# Patient Record
Sex: Female | Born: 1980 | Race: Asian | Hispanic: No | Marital: Married | Smoking: Never smoker
Health system: Southern US, Community
[De-identification: ages and names within clinical notes are randomized; demographics above are authoritative.]

## PROBLEM LIST (undated history)

## (undated) DIAGNOSIS — Z8759 Personal history of other complications of pregnancy, childbirth and the puerperium: Secondary | ICD-10-CM

## (undated) HISTORY — PX: WISDOM TOOTH EXTRACTION: SHX21

---

## 2011-02-01 ENCOUNTER — Other Ambulatory Visit (HOSPITAL_COMMUNITY)
Admission: RE | Admit: 2011-02-01 | Discharge: 2011-02-01 | Disposition: A | Discharge status: Home / Self Care | Payer: PRIVATE HEALTH INSURANCE | Source: Ambulatory Visit | Attending: Family Medicine | Admitting: Family Medicine

## 2011-02-01 DIAGNOSIS — Z124 Encounter for screening for malignant neoplasm of cervix: Secondary | ICD-10-CM | POA: Insufficient documentation

## 2011-02-01 DIAGNOSIS — Z1159 Encounter for screening for other viral diseases: Secondary | ICD-10-CM | POA: Insufficient documentation

## 2011-02-01 DIAGNOSIS — N760 Acute vaginitis: Secondary | ICD-10-CM | POA: Insufficient documentation

## 2011-05-30 ENCOUNTER — Other Ambulatory Visit: Payer: Self-pay | Admitting: Family Medicine

## 2011-05-31 ENCOUNTER — Other Ambulatory Visit: Payer: Self-pay | Admitting: Family Medicine

## 2011-05-31 DIAGNOSIS — N649 Disorder of breast, unspecified: Secondary | ICD-10-CM

## 2011-06-14 ENCOUNTER — Ambulatory Visit
Admission: RE | Admit: 2011-06-14 | Discharge: 2011-06-14 | Disposition: A | Discharge status: Home / Self Care | Payer: 59 | Source: Ambulatory Visit | Attending: Family Medicine | Admitting: Family Medicine

## 2011-06-14 DIAGNOSIS — N649 Disorder of breast, unspecified: Secondary | ICD-10-CM

## 2013-03-08 ENCOUNTER — Other Ambulatory Visit: Payer: Self-pay | Admitting: Family Medicine

## 2013-03-08 DIAGNOSIS — N926 Irregular menstruation, unspecified: Secondary | ICD-10-CM

## 2013-03-15 ENCOUNTER — Ambulatory Visit
Admission: RE | Admit: 2013-03-15 | Discharge: 2013-03-15 | Disposition: A | Discharge status: Home / Self Care | Payer: BC Managed Care – PPO | Source: Ambulatory Visit | Attending: Family Medicine | Admitting: Family Medicine

## 2013-03-15 DIAGNOSIS — N926 Irregular menstruation, unspecified: Secondary | ICD-10-CM

## 2013-04-14 ENCOUNTER — Encounter (HOSPITAL_COMMUNITY): Payer: Self-pay | Admitting: Pharmacist

## 2013-04-14 NOTE — H&P (Signed)
GYN H&P  Natasha Carpenter is 32y G0 who presents for removal of her deromid cyst.  She was referred to my office for intermittent lower abdominal pain.  The patient reports the pain as 7/10 when it is at it's worst and feels the pain mostly when sitting down or when sleeping. An ultrasound was done to assess her pain and revealed a multilobulated complex mass on her left (see below imaging studies).  She does report occasional abdominal pain today, but states the pain is only a 2/10. She denies fever/chills, nausea/vomiting.  She is currently on her menses and reports minmal vaginal bleeding, requiring only one pad per day.    Medical History: H/o left breast cyst- 2012.        Gyn History:  Sexual activity currently sexually active with fiance of 4 years.  Periods : every month.  LMP  : 04/02/13 Birth control none.  Last pap smear date 02/01/2011 Negative.  Last mammogram date 06/14/11.  STD none.        OB History:  Never been pregnant per patient.        Surgical History: Denies Past Surgical History.        Hospitalization/Major Diagnostic Procedure: Denies Past Hospitalization.        Family History: Father: alive, stroke at 28, HTNMother: alive, A + WSister 1: alive, A + Teacher, music 2: alive, A + W No family history of colon, breast or ovarian cancer per pt No heart disease. No DM, denies any GYN family cancer hx.       Social History:  General:  Tobacco use  cigarettes: Never smoked no Smoking.  no Tobacco Exposure.  no Alcohol.  Caffeine: yes, tea, very little.  no Recreational drug use.  Diet: yes.  Exercise: yes, walks, 2-3 x weekly.  Occupation: employed, Airline pilot.  Marital Status: engaged.  Children: none.        Medications: Taking Advil 200 MG Tablet 1 tablet as needed every 6 hrs, Discontinued Junel 1/20 1-20 MG-MCG Tablet 1 tablet Once a day, Medication List reviewed and reconciled with the patient       Allergies: N.K.D.A.          Vitals: Wt 98, Wt  change -.8 lb, Ht 59.5, BMI 19.46, Pulse sitting 84, BP sitting 112/76.     Vitals: BMI: 19.46, BP: 112/76 Gen: NAD CV: RRR Lungs: CTAB Abd: soft, non-tender, non-distended, no rebound, no guarding GU (performed 10/3): normal external genitalia, vagina - pink moist mucosa, minimal menstrual bleeding appreciated. On bimanual exam: left mass palpated, mobile, non-tender, + posterior fullness.   U/S on 03/15/13: Endometrial thickness: 7mm. Posterior uterine fibroid 2cm x 1cm x 2cm. L ovary with multilobulated complex mass with calcifications consistent with dermoid cyst- 6cm x 3cm x 5.2cm. Right ovary with small 2.2cm x 1.8cm x 2.1cm thin walled septation.  A/P: 32y G0 who presents for laparoscopic cystectomy due to left dermoid cyst. -NPO @ midnight prior to surgery -LR @ 125cc/hr -urine pregnancy & CBC to be collected prior to surgery -no antibiotics indicated -Reviewed with patient- risk, benefit, indications and alternatives including risk of bleeding, infection, injury to other organs and possible removal of left ovary.  All questions and concerns were answered.  Myna Hidalgo, DO [phone] (pager) [phone] (office)

## 2013-04-22 ENCOUNTER — Encounter (HOSPITAL_COMMUNITY): Payer: Self-pay | Admitting: *Deleted

## 2013-04-22 ENCOUNTER — Ambulatory Visit (HOSPITAL_COMMUNITY): Payer: BC Managed Care – PPO | Admitting: Anesthesiology

## 2013-04-22 ENCOUNTER — Encounter (HOSPITAL_COMMUNITY): Payer: BC Managed Care – PPO | Admitting: Anesthesiology

## 2013-04-22 ENCOUNTER — Encounter (HOSPITAL_COMMUNITY)
Admission: RE | Disposition: A | Discharge status: Home / Self Care | Payer: Self-pay | Source: Ambulatory Visit | Attending: Obstetrics & Gynecology

## 2013-04-22 ENCOUNTER — Ambulatory Visit (HOSPITAL_COMMUNITY)
Admission: RE | Admit: 2013-04-22 | Discharge: 2013-04-22 | Disposition: A | Discharge status: Home / Self Care | Payer: BC Managed Care – PPO | Source: Ambulatory Visit | Attending: Obstetrics & Gynecology | Admitting: Obstetrics & Gynecology

## 2013-04-22 DIAGNOSIS — N83209 Unspecified ovarian cyst, unspecified side: Secondary | ICD-10-CM | POA: Insufficient documentation

## 2013-04-22 DIAGNOSIS — N7013 Chronic salpingitis and oophoritis: Secondary | ICD-10-CM | POA: Insufficient documentation

## 2013-04-22 DIAGNOSIS — N854 Malposition of uterus: Secondary | ICD-10-CM | POA: Insufficient documentation

## 2013-04-22 DIAGNOSIS — N80109 Endometriosis of ovary, unspecified side, unspecified depth: Secondary | ICD-10-CM | POA: Insufficient documentation

## 2013-04-22 DIAGNOSIS — N801 Endometriosis of ovary: Secondary | ICD-10-CM | POA: Insufficient documentation

## 2013-04-22 HISTORY — PX: LAPAROSCOPY: SHX197

## 2013-04-22 LAB — CBC
HCT: 42.1 % (ref 36.0–46.0)
Hemoglobin: 14.5 g/dL (ref 12.0–15.0)
MCHC: 34.4 g/dL (ref 30.0–36.0)
RBC: 4.82 MIL/uL (ref 3.87–5.11)
WBC: 5.1 10*3/uL (ref 4.0–10.5)

## 2013-04-22 LAB — HCG, QUANTITATIVE, PREGNANCY: hCG, Beta Chain, Quant, S: <1 m[IU]/mL (ref <5)

## 2013-04-22 SURGERY — LAPAROSCOPY OPERATIVE
Anesthesia: General | Site: Abdomen | Wound class: Clean Contaminated

## 2013-04-22 MED ORDER — KETOROLAC TROMETHAMINE 30 MG/ML IJ SOLN: 15.0000 mg | Freq: Once | INTRAMUSCULAR | Status: DC | PRN

## 2013-04-22 MED ORDER — MIDAZOLAM HCL 2 MG/2ML IJ SOLN
INTRAMUSCULAR | Status: AC
  Filled 2013-04-22: qty 2

## 2013-04-22 MED ORDER — KETOROLAC TROMETHAMINE 30 MG/ML IJ SOLN
INTRAMUSCULAR | Status: DC | PRN
  Administered 2013-04-22: 30 mg via INTRAVENOUS

## 2013-04-22 MED ORDER — KETOROLAC TROMETHAMINE 30 MG/ML IJ SOLN
INTRAMUSCULAR | Status: AC
  Filled 2013-04-22: qty 1

## 2013-04-22 MED ORDER — ACETAMINOPHEN 160 MG/5ML PO SOLN
975.0000 mg | Freq: Once | ORAL | Status: AC
  Administered 2013-04-22: 975 mg via ORAL

## 2013-04-22 MED ORDER — ONDANSETRON HCL 4 MG/2ML IJ SOLN
INTRAMUSCULAR | Status: AC
  Filled 2013-04-22: qty 2

## 2013-04-22 MED ORDER — IBUPROFEN 200 MG PO TABS: 600.0000 mg | ORAL_TABLET | Freq: Four times a day (QID) | ORAL | Status: DC | PRN

## 2013-04-22 MED ORDER — ONDANSETRON HCL 4 MG/2ML IJ SOLN
INTRAMUSCULAR | Status: DC | PRN
  Administered 2013-04-22: 4 mg via INTRAVENOUS

## 2013-04-22 MED ORDER — LACTATED RINGERS IV SOLN
INTRAVENOUS | Status: DC
  Administered 2013-04-22 (×2): via INTRAVENOUS

## 2013-04-22 MED ORDER — PROPOFOL 10 MG/ML IV EMUL
INTRAVENOUS | Status: AC
  Filled 2013-04-22: qty 20

## 2013-04-22 MED ORDER — ROCURONIUM BROMIDE 100 MG/10ML IV SOLN
INTRAVENOUS | Status: DC | PRN
  Administered 2013-04-22: 30 mg via INTRAVENOUS

## 2013-04-22 MED ORDER — LIDOCAINE HCL (CARDIAC) 20 MG/ML IV SOLN
INTRAVENOUS | Status: DC | PRN
  Administered 2013-04-22: 100 mg via INTRAVENOUS

## 2013-04-22 MED ORDER — PROPOFOL 10 MG/ML IV BOLUS
INTRAVENOUS | Status: DC | PRN
  Administered 2013-04-22: 150 mg via INTRAVENOUS

## 2013-04-22 MED ORDER — DEXAMETHASONE SODIUM PHOSPHATE 10 MG/ML IJ SOLN
INTRAMUSCULAR | Status: AC
  Filled 2013-04-22: qty 1

## 2013-04-22 MED ORDER — FENTANYL CITRATE 0.05 MG/ML IJ SOLN: 25.0000 ug | INTRAMUSCULAR | Status: DC | PRN

## 2013-04-22 MED ORDER — ROCURONIUM BROMIDE 100 MG/10ML IV SOLN
INTRAVENOUS | Status: AC
  Filled 2013-04-22: qty 1

## 2013-04-22 MED ORDER — FENTANYL CITRATE 0.05 MG/ML IJ SOLN
INTRAMUSCULAR | Status: DC | PRN
  Administered 2013-04-22 (×3): 50 ug via INTRAVENOUS

## 2013-04-22 MED ORDER — NEOSTIGMINE METHYLSULFATE 1 MG/ML IJ SOLN
INTRAMUSCULAR | Status: AC
  Filled 2013-04-22: qty 1

## 2013-04-22 MED ORDER — HEPARIN SODIUM (PORCINE) 5000 UNIT/ML IJ SOLN
INTRAMUSCULAR | Status: AC
  Filled 2013-04-22: qty 1

## 2013-04-22 MED ORDER — ACETAMINOPHEN 160 MG/5ML PO SOLN
ORAL | Status: AC
  Administered 2013-04-22: 975 mg via ORAL
  Filled 2013-04-22: qty 40.6

## 2013-04-22 MED ORDER — NEOSTIGMINE METHYLSULFATE 1 MG/ML IJ SOLN
INTRAMUSCULAR | Status: DC | PRN
  Administered 2013-04-22: 3 mg via INTRAVENOUS

## 2013-04-22 MED ORDER — LACTATED RINGERS IV SOLN
INTRAVENOUS | Status: DC
  Administered 2013-04-22: 11:00:00 via INTRAVENOUS

## 2013-04-22 MED ORDER — LACTATED RINGERS IV SOLN
INTRAVENOUS | Status: DC | PRN
  Administered 2013-04-22: 1

## 2013-04-22 MED ORDER — MIDAZOLAM HCL 2 MG/2ML IJ SOLN
INTRAMUSCULAR | Status: DC | PRN
  Administered 2013-04-22: 2 mg via INTRAVENOUS

## 2013-04-22 MED ORDER — MEPERIDINE HCL 25 MG/ML IJ SOLN: 6.2500 mg | INTRAMUSCULAR | Status: DC | PRN

## 2013-04-22 MED ORDER — FENTANYL CITRATE 0.05 MG/ML IJ SOLN
INTRAMUSCULAR | Status: AC
  Filled 2013-04-22: qty 2

## 2013-04-22 MED ORDER — GLYCOPYRROLATE 0.2 MG/ML IJ SOLN
INTRAMUSCULAR | Status: DC | PRN
  Administered 2013-04-22: 0.4 mg via INTRAVENOUS

## 2013-04-22 MED ORDER — SODIUM CHLORIDE 0.9 % IJ SOLN
INTRAMUSCULAR | Status: AC
  Filled 2013-04-22: qty 10

## 2013-04-22 MED ORDER — LIDOCAINE HCL (CARDIAC) 20 MG/ML IV SOLN
INTRAVENOUS | Status: AC
  Filled 2013-04-22: qty 5

## 2013-04-22 MED ORDER — DOCUSATE SODIUM 50 MG PO CAPS: 100.0000 mg | ORAL_CAPSULE | Freq: Two times a day (BID) | ORAL | Status: DC | PRN

## 2013-04-22 MED ORDER — HYDROCODONE-ACETAMINOPHEN 5-325 MG PO TABS: 1.0000 | ORAL_TABLET | Freq: Four times a day (QID) | ORAL | Status: DC | PRN

## 2013-04-22 MED ORDER — DEXAMETHASONE SODIUM PHOSPHATE 10 MG/ML IJ SOLN
INTRAMUSCULAR | Status: DC | PRN
  Administered 2013-04-22: 10 mg via INTRAVENOUS

## 2013-04-22 MED ORDER — METOCLOPRAMIDE HCL 5 MG/ML IJ SOLN: 10.0000 mg | Freq: Once | INTRAMUSCULAR | Status: DC | PRN

## 2013-04-22 MED ORDER — SODIUM CHLORIDE 0.9 % IJ SOLN
INTRAMUSCULAR | Status: DC | PRN
  Administered 2013-04-22: 10 mL

## 2013-04-22 MED ORDER — FENTANYL CITRATE 0.05 MG/ML IJ SOLN
INTRAMUSCULAR | Status: AC
  Filled 2013-04-22: qty 5

## 2013-04-22 MED ORDER — BUPIVACAINE HCL (PF) 0.25 % IJ SOLN
INTRAMUSCULAR | Status: DC | PRN
  Administered 2013-04-22: 7 mL

## 2013-04-22 MED ORDER — BUPIVACAINE HCL (PF) 0.25 % IJ SOLN
INTRAMUSCULAR | Status: AC
  Filled 2013-04-22: qty 30

## 2013-04-22 MED ORDER — GLYCOPYRROLATE 0.2 MG/ML IJ SOLN
INTRAMUSCULAR | Status: AC
  Filled 2013-04-22: qty 3

## 2013-04-22 SURGICAL SUPPLY — 29 items
BENZOIN TINCTURE PRP APPL 2/3 (GAUZE/BANDAGES/DRESSINGS) IMPLANT
CHLORAPREP W/TINT 26ML (MISCELLANEOUS) ×2 IMPLANT
CLOTH BEACON ORANGE TIMEOUT ST (SAFETY) ×2 IMPLANT
DERMABOND ADVANCED (GAUZE/BANDAGES/DRESSINGS) ×1
DERMABOND ADVANCED .7 DNX12 (GAUZE/BANDAGES/DRESSINGS) ×1 IMPLANT
FORCEPS CUTTING 33CM 5MM (CUTTING FORCEPS) IMPLANT
GLOVE BIO SURGEON STRL SZ 6.5 (GLOVE) ×4 IMPLANT
GLOVE BIOGEL PI IND STRL 7.0 (GLOVE) ×2 IMPLANT
GLOVE BIOGEL PI INDICATOR 7.0 (GLOVE) ×2
GLOVE NEODERM STER SZ 7 (GLOVE) ×2 IMPLANT
GOWN PREVENTION PLUS LG XLONG (DISPOSABLE) ×4 IMPLANT
HEMOSTAT SURGICEL 2X3 (HEMOSTASIS) ×2 IMPLANT
MANIPULATOR UTERINE 4.5 ZUMI (MISCELLANEOUS) ×2 IMPLANT
NEEDLE INSUFFLATION 120MM (ENDOMECHANICALS) ×2 IMPLANT
PACK LAPAROSCOPY BASIN (CUSTOM PROCEDURE TRAY) ×2 IMPLANT
POUCH SPECIMEN RETRIEVAL 10MM (ENDOMECHANICALS) IMPLANT
PROTECTOR NERVE ULNAR (MISCELLANEOUS) ×2 IMPLANT
SCALPEL HARMONIC ACE (MISCELLANEOUS) IMPLANT
SET IRRIG TUBING LAPAROSCOPIC (IRRIGATION / IRRIGATOR) ×2 IMPLANT
STRIP CLOSURE SKIN 1/4X4 (GAUZE/BANDAGES/DRESSINGS) IMPLANT
SUT VIC AB 4-0 PS2 27 (SUTURE) ×2 IMPLANT
SUT VICRYL 0 UR6 27IN ABS (SUTURE) ×2 IMPLANT
TOWEL OR 17X24 6PK STRL BLUE (TOWEL DISPOSABLE) ×4 IMPLANT
TRAY FOLEY CATH 14FR (SET/KITS/TRAYS/PACK) ×2 IMPLANT
TROCAR XCEL NON-BLD 11X100MML (ENDOMECHANICALS) ×2 IMPLANT
TROCAR XCEL NON-BLD 5MMX100MML (ENDOMECHANICALS) ×2 IMPLANT
TROCAR XCEL OPT SLVE 5M 100M (ENDOMECHANICALS) ×2 IMPLANT
WARMER LAPAROSCOPE (MISCELLANEOUS) ×2 IMPLANT
WATER STERILE IRR 1000ML POUR (IV SOLUTION) ×2 IMPLANT

## 2013-04-22 NOTE — Anesthesia Preprocedure Evaluation (Addendum)
Anesthesia Evaluation  Patient identified by MRN, date of birth, ID band Patient awake    Reviewed: Allergy & Precautions, H&P , NPO status , Patient's Chart, lab work & pertinent test results, reviewed documented beta blocker date and time   History of Anesthesia Complications Negative for: history of anesthetic complications  Airway Mallampati: II TM Distance: >3 FB Neck ROM: full    Dental  (+) Teeth Intact Full braces:   Pulmonary neg pulmonary ROS,  breath sounds clear to auscultation  Pulmonary exam normal       Cardiovascular negative cardio ROS  Rhythm:regular Rate:Normal     Neuro/Psych negative neurological ROS  negative psych ROS   GI/Hepatic negative GI ROS, Neg liver ROS,   Endo/Other  negative endocrine ROS  Renal/GU negative Renal ROS  Female GU complaint     Musculoskeletal   Abdominal   Peds  Hematology negative hematology ROS (+)   Anesthesia Other Findings   Reproductive/Obstetrics negative OB ROS                          Anesthesia Physical Anesthesia Plan  ASA: I  Anesthesia Plan: General ETT   Post-op Pain Management:    Induction:   Airway Management Planned:   Additional Equipment:   Intra-op Plan:   Post-operative Plan:   Informed Consent: I have reviewed the patients History and Physical, chart, labs and discussed the procedure including the risks, benefits and alternatives for the proposed anesthesia with the patient or authorized representative who has indicated his/her understanding and acceptance.   Dental Advisory Given  Plan Discussed with: CRNA and Surgeon  Anesthesia Plan Comments:         Anesthesia Quick Evaluation

## 2013-04-22 NOTE — Anesthesia Postprocedure Evaluation (Signed)
  Anesthesia Post Note  Patient: Natasha Carpenter  Procedure(s) Performed: Procedure(s) (LRB): Diagnostic Operative Laparoscopy, Lysis of Adhesions, Drainage of Endometriomas (N/A)  Anesthesia type: GA  Patient location: PACU  Post pain: Pain level controlled  Post assessment: Post-op Vital signs reviewed  Last Vitals:  Filed Vitals:   04/22/13 1445  BP: 97/63  Pulse: 66  Temp:   Resp: 13    Post vital signs: Reviewed  Level of consciousness: sedated  Complications: No apparent anesthesia complications

## 2013-04-22 NOTE — Interval H&P Note (Signed)
History and Physical Interval Note:  04/22/2013 11:22 AM  Natasha Carpenter  has presented today for surgery, with the diagnosis of an Ovarian Cyst CPT [phone]  The various methods of treatment have been discussed with the patient and family. After consideration of risks, benefits and other options for treatment, the patient has consented to  Procedure(s): LAPAROSCOPY OPERATIVE WITH OVARIAN CYSTECTOMY (N/A) as a surgical intervention .  The patient's history has been reviewed, patient examined, no change in status, stable for surgery.  I have reviewed the patient's chart and labs.  Questions were answered to the patient's satisfaction.     Myna Hidalgo, M

## 2013-04-22 NOTE — Op Note (Signed)
04/22/2013  2:39 PM  PATIENT:  Natasha Carpenter  32 y.o. female  PRE-OPERATIVE DIAGNOSIS:  Ovarian Cyst CPT [phone]  POST-OPERATIVE DIAGNOSIS:  Ovarian Cyst, endometriosis  PROCEDURE:  Operative Laparoscopy, Lysis of Adhesions, Drainage of Endometriomas  SURGEON:  Surgeon(s) and Role:    * Sharon Seller, DO - Primary    * Geryl Rankins, MD - Assisting   ANESTHESIA:   general  EBL:  Total I/O In: 1600 [I.V.:1600] Out: 300 [Urine:250; Blood:50]  BLOOD ADMINISTERED:none  DRAINS: Urinary Catheter (Foley)   LOCAL MEDICATIONS USED:  MARCAINE     SPECIMEN:  Source of Specimen:  left ovarian cyst wall  DISPOSITION OF SPECIMEN:  PATHOLOGY  COUNTS:  YES   FINDINGS: No ascites or peritoneal studding was appreciated on entry into the abdomen.  Liver, gallbladder and bowel appeared grossly normal.  Appendix not visualized.  Uterus normal size and shape.  Right tube and ovary normal in appearance.  Left ovary with multiple endometriomas distorting the entire left adnexa.  Total left ovarian size 5cmx7cm.  Three endometriomas each approximately 2-3cm with chocolate filled fluid.  Left hydrosalpinx.  Dense adhesions noted between left fallopian tube, ovary, pelvic side wall and bowel. Both black powder-burn and red lesions were noted in both the anterior and posterior cul-de-sac, left adnexa and left pelvic side wall.  Some obliteration of the posterior cul-de-sac due to adhesions was also appreciated.    Informed consent was obtained from the patient prior to taking her to the operating room where anesthesia was found to be adequate. She was placed in dorsal lithotomy position and examined under anesthesia. She was prepped and draped in normal sterile fashion. The bladder was catheterized with a foley under sterile technique.  A bi-valve speculum was then placed and the anterior lip of the cervix was grasped with the single tooth tenaculum. The retroverted uterus was sounded to 7 cm and a  manipulator was advanced into the uterus to provide uterine mobility. The speculum and was then removed.  Attention was then turned to the patient's abdomen where a 10 mm infraumbilical skin incision was made with the scalpel. The veress needle was carefully introduced into the peritoneal cavity while tenting the abdominal wall. Intraperitoneal placement was confirmed by use of a saline-drop test.  The gas was connected and confirmed intrabdominal placement by a low initial pressure of 7 mmHg. The abdomen was then insuflated with CO2 gas. The trocar and sleeve were then advanced without difficulty into the abdomen under direct visualization. Intraabdominal placement was confirmed by the laparoscope and surveillance of the abdomen was performed. Findings as described above. Two additional 5mm skin incision were made in the left and right lower quadrants with placement of the trocar under direct visualization.     In an attempt to normalize the anatomy, the left adnexa was untwisted and filmy adhesions between the bowel and left adnexal were taken down with gentle traction.  The ovarian cysts were ruptured with suction of chocolate colored fluid.  Due to the extent of the endometriomas, separation of the cyst wall from normal ovarian tissue was very difficult.  A small portion of the cyst wall was removed and sent to pathology.  The left fallopian tube was dilated and noted to be adherent to the bowel and adjacent inferior pelvic wall that was not able to be separated with gentle traction.  Moreover, the adhesions obstructed visualization of the ureter.  The pelvis was copiously washed and irrigated.  Bleeding noted between the tube  and ovary was cauterized using bipolar.  Hemostasis was obtained and surgicel was placed over the left fallopian tube and ovary.  Re-examination of the uterus and abdominal cavity confirmed hemostasis.  All trocars were removed under direct visualization allowing gas to fully escape.   The fascia was closed using 0-vicryl without difficulty under direct visualization.  The abdominal port site was closed with 4-0 vicryl and dermabond.  The lateral ports were closed with dermabond.  The manipulator was then removed from the cervix with no lacerations or bleeding identified. The patient tolerated the procedure well and was taken to recovery in stable condition.   PLAN OF CARE: Discharge to home after PACU  PATIENT DISPOSITION:  PACU - hemodynamically stable.   Delay start of Pharmacological VTE agent (>24hrs) due to surgical blood loss or risk of bleeding: not applicable

## 2013-04-22 NOTE — Transfer of Care (Signed)
Immediate Anesthesia Transfer of Care Note  Patient: Natasha Carpenter  Procedure(s) Performed: Procedure(s): Diagnostic Operative Laparoscopy, Lysis of Adhesions, Drainage of Endometriomas (N/A)  Patient Location: PACU  Anesthesia Type:General  Level of Consciousness: awake  Airway & Oxygen Therapy: Patient Spontanous Breathing and Patient connected to nasal cannula oxygen  Post-op Assessment: Report given to PACU RN and Post -op Vital signs reviewed and stable  Post vital signs: Reviewed and stable  Complications: No apparent anesthesia complications

## 2013-04-22 NOTE — Anesthesia Procedure Notes (Signed)
Procedure Name: Intubation Date/Time: 04/22/2013 12:14 PM Performed by: Nilaya Bouie, Jannet Askew Pre-anesthesia Checklist: Patient identified, Timeout performed, Emergency Drugs available, Suction available and Patient being monitored Patient Re-evaluated:Patient Re-evaluated prior to inductionOxygen Delivery Method: Circle system utilized Preoxygenation: Pre-oxygenation with 100% oxygen Intubation Type: IV induction Ventilation: Mask ventilation without difficulty Laryngoscope Size: Mac and 3 Tube size: 7.0 mm Number of attempts: 1 Secured at: 19 cm Dental Injury: Teeth and Oropharynx as per pre-operative assessment

## 2013-04-23 ENCOUNTER — Encounter (HOSPITAL_COMMUNITY): Payer: Self-pay | Admitting: Obstetrics & Gynecology

## 2013-06-25 ENCOUNTER — Other Ambulatory Visit (HOSPITAL_COMMUNITY): Payer: Self-pay | Admitting: Obstetrics and Gynecology

## 2013-06-25 DIAGNOSIS — IMO0002 Reserved for concepts with insufficient information to code with codable children: Secondary | ICD-10-CM

## 2013-07-05 ENCOUNTER — Encounter (INDEPENDENT_AMBULATORY_CARE_PROVIDER_SITE_OTHER): Payer: Self-pay

## 2013-07-05 ENCOUNTER — Ambulatory Visit (HOSPITAL_COMMUNITY)
Admission: RE | Admit: 2013-07-05 | Discharge: 2013-07-05 | Disposition: A | Discharge status: Home / Self Care | Payer: BC Managed Care – PPO | Source: Ambulatory Visit | Attending: Obstetrics and Gynecology | Admitting: Obstetrics and Gynecology

## 2013-07-05 DIAGNOSIS — N979 Female infertility, unspecified: Secondary | ICD-10-CM | POA: Insufficient documentation

## 2013-07-05 DIAGNOSIS — IMO0002 Reserved for concepts with insufficient information to code with codable children: Secondary | ICD-10-CM

## 2013-07-05 MED ORDER — IOHEXOL 300 MG/ML  SOLN
20.0000 mL | Freq: Once | INTRAMUSCULAR | Status: AC | PRN
  Administered 2013-07-05: 12 mL

## 2013-11-11 ENCOUNTER — Other Ambulatory Visit: Payer: Self-pay | Admitting: Family Medicine

## 2013-11-11 ENCOUNTER — Other Ambulatory Visit (HOSPITAL_COMMUNITY)
Admission: RE | Admit: 2013-11-11 | Discharge: 2013-11-11 | Disposition: A | Discharge status: Home / Self Care | Payer: BC Managed Care – PPO | Source: Ambulatory Visit | Attending: Family Medicine | Admitting: Family Medicine

## 2013-11-11 DIAGNOSIS — Z124 Encounter for screening for malignant neoplasm of cervix: Secondary | ICD-10-CM | POA: Insufficient documentation

## 2013-11-11 DIAGNOSIS — Z1151 Encounter for screening for human papillomavirus (HPV): Secondary | ICD-10-CM | POA: Insufficient documentation

## 2014-03-28 ENCOUNTER — Other Ambulatory Visit: Payer: Self-pay | Admitting: Gastroenterology

## 2014-03-28 DIAGNOSIS — R1013 Epigastric pain: Secondary | ICD-10-CM

## 2014-04-01 ENCOUNTER — Ambulatory Visit
Admission: RE | Admit: 2014-04-01 | Discharge: 2014-04-01 | Disposition: A | Discharge status: Home / Self Care | Payer: BC Managed Care – PPO | Source: Ambulatory Visit | Attending: Gastroenterology | Admitting: Gastroenterology

## 2014-04-01 DIAGNOSIS — R1013 Epigastric pain: Secondary | ICD-10-CM

## 2015-05-29 LAB — OB RESULTS CONSOLE ABO/RH: RH Type: POSITIVE

## 2015-05-29 LAB — OB RESULTS CONSOLE ANTIBODY SCREEN: Antibody Screen: NEGATIVE

## 2015-05-29 LAB — OB RESULTS CONSOLE RPR: RPR: NONREACTIVE

## 2015-05-29 LAB — OB RESULTS CONSOLE HEPATITIS B SURFACE ANTIGEN: HEP B S AG: NEGATIVE

## 2015-05-29 LAB — OB RESULTS CONSOLE GC/CHLAMYDIA
Chlamydia: NEGATIVE
Gonorrhea: NEGATIVE

## 2015-05-29 LAB — OB RESULTS CONSOLE HIV ANTIBODY (ROUTINE TESTING): HIV: NONREACTIVE

## 2015-05-29 LAB — OB RESULTS CONSOLE RUBELLA ANTIBODY, IGM: RUBELLA: IMMUNE

## 2015-06-20 IMAGING — RF DG ESOPHAGUS
10 of 19 series · 12 of 24 positions shown · non-contrast
Comparison: None.

CLINICAL DATA: Dyspepsia.

EXAM:
ESOPHOGRAM / BARIUM SWALLOW / BARIUM TABLET STUDY
TECHNIQUE: Combined double contrast and single contrast examination performed
using effervescent crystals, thick barium liquid, and thin barium
liquid. The patient was observed with fluoroscopy swallowing a 13mm
barium sulphate tablet.
FLUOROSCOPY TIME:  1 min, 24 seconds

[Series 1: run · 3 of 35 slices shown (1 of 10)]
[im 7/35]
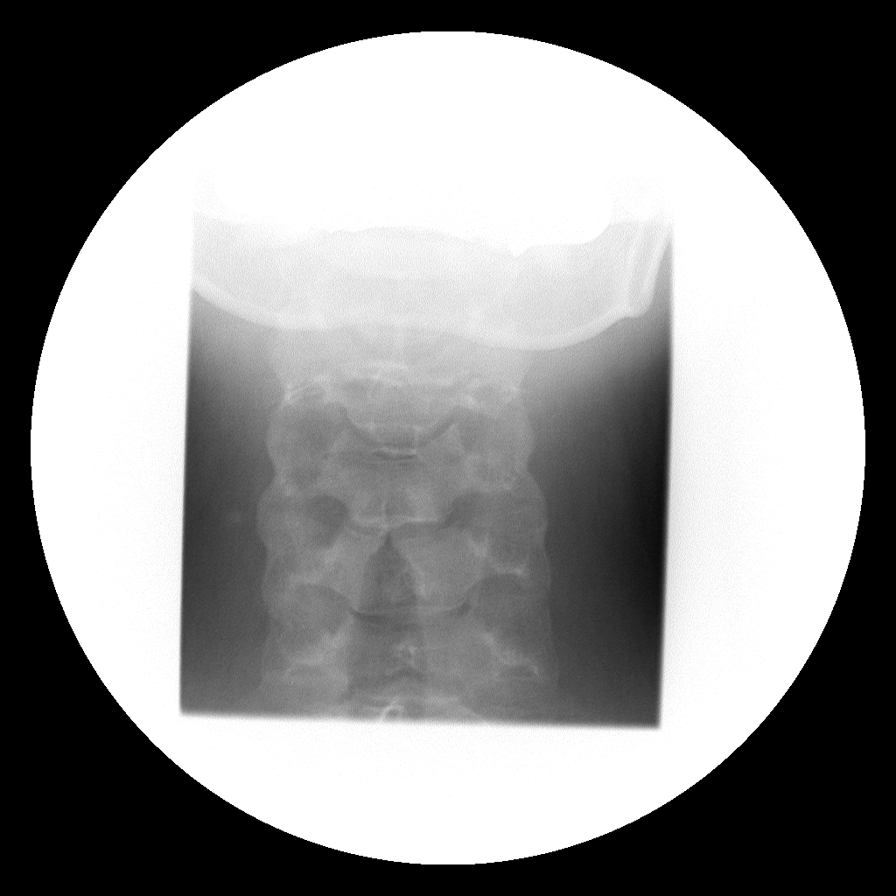
[im 21/35]
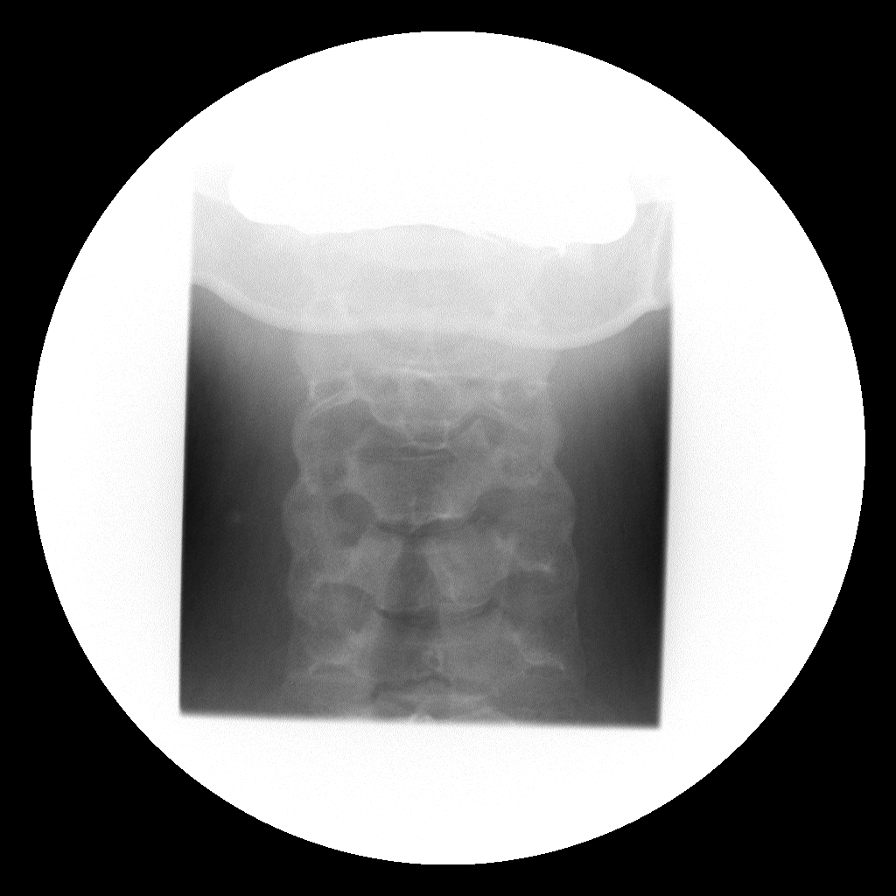
[im 35/35]
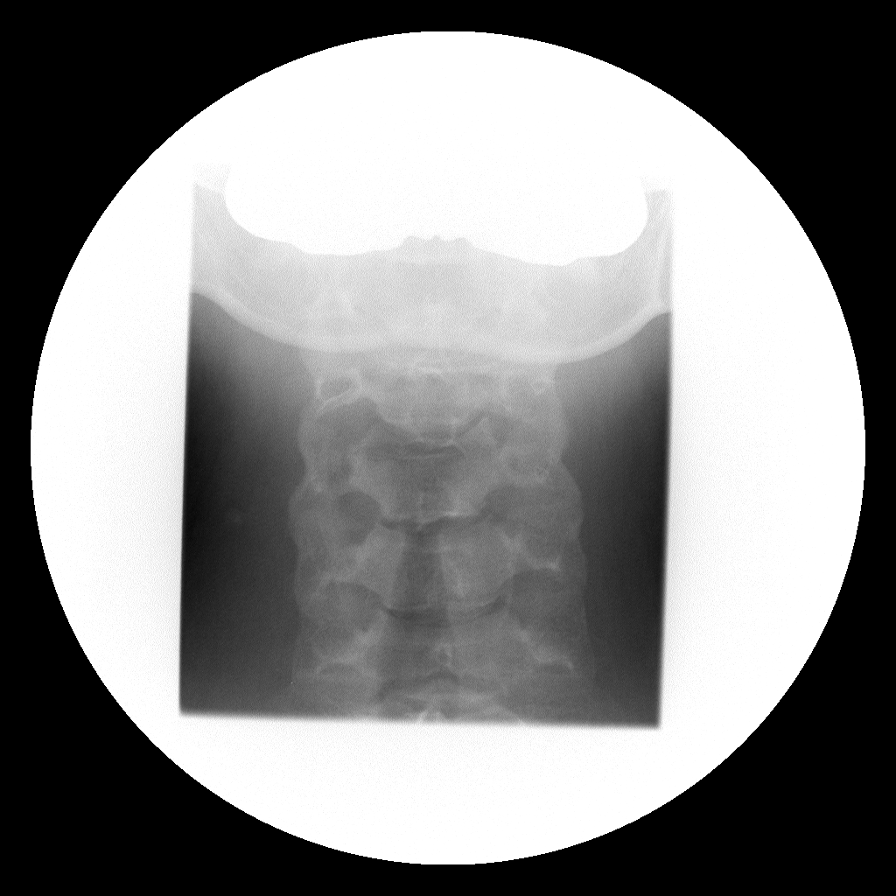

[Series 3: run · 1 of 16 slices shown (2 of 10)]
[im 1/16]
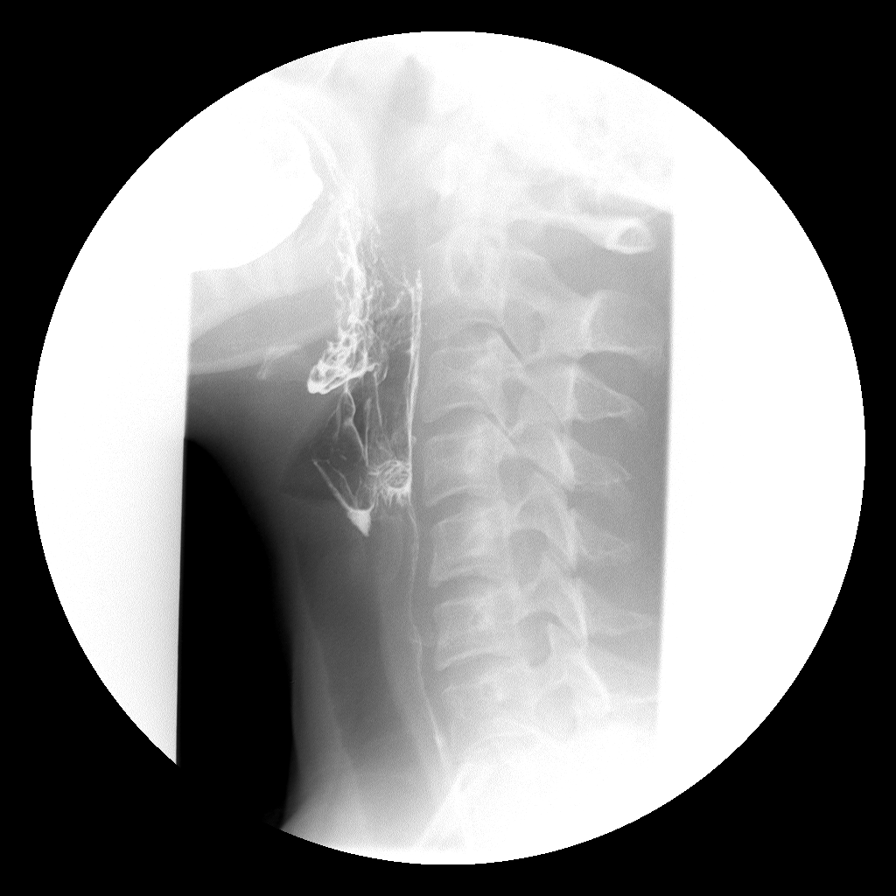

[Series 5: run · 1 of 1 slices shown (3 of 10)]
[im 1/1]
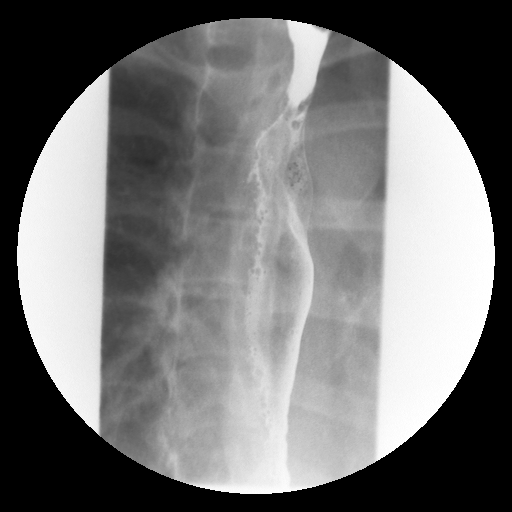

[Series 7: run · 1 of 1 slices shown (4 of 10)]
[im 1/1]
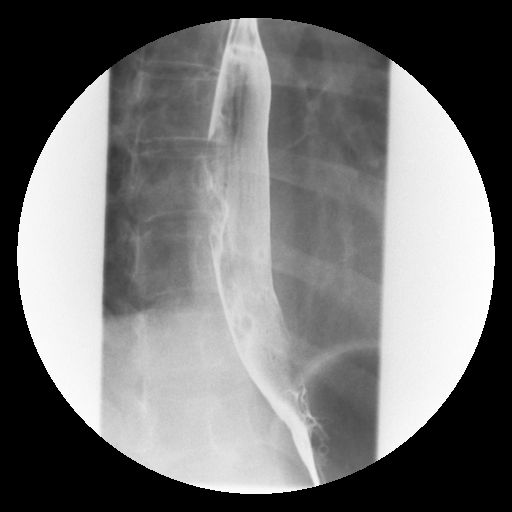

[Series 9: run · 1 of 1 slices shown (5 of 10)]
[im 1/1]
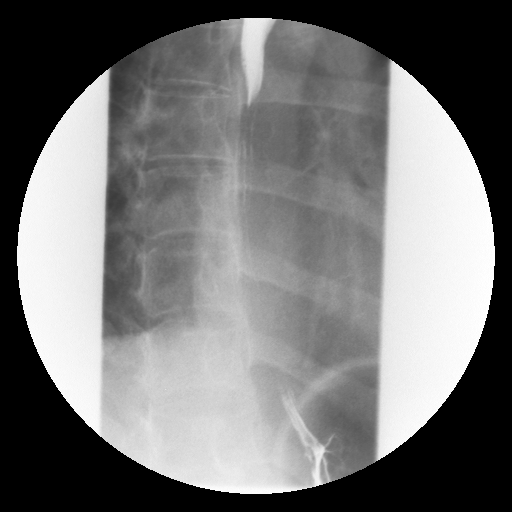

[Series 11: run · 1 of 1 slices shown (6 of 10)]
[im 1/1]
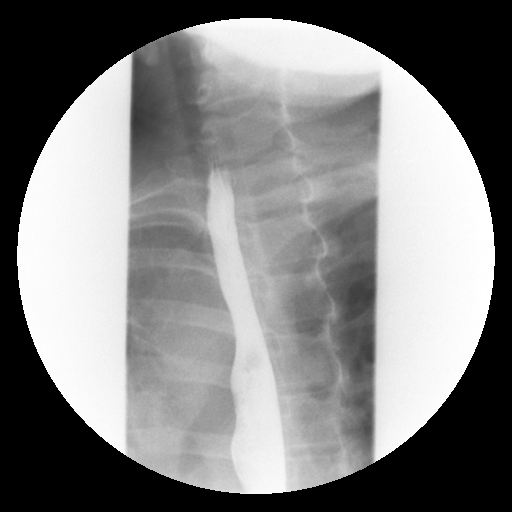

[Series 13: run · 1 of 1 slices shown (7 of 10)]
[im 1/1]
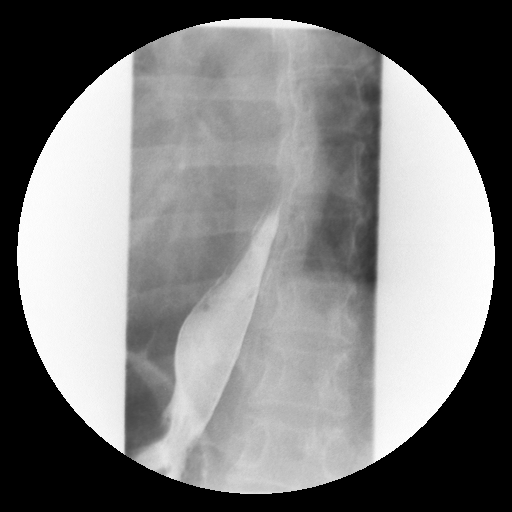

[Series 15: run · 1 of 1 slices shown (8 of 10)]
[im 1/1]
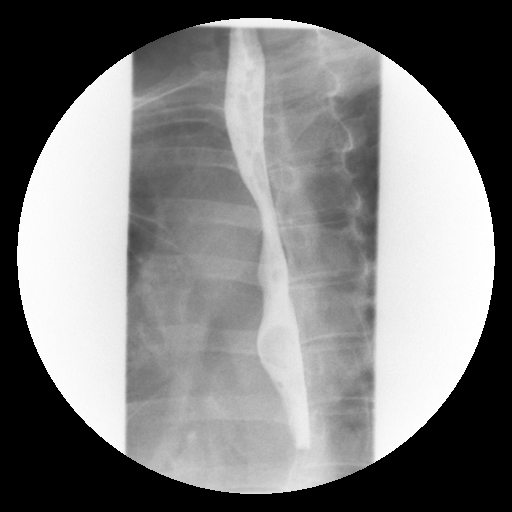

[Series 17: run · 1 of 1 slices shown (9 of 10)]
[im 1/1]
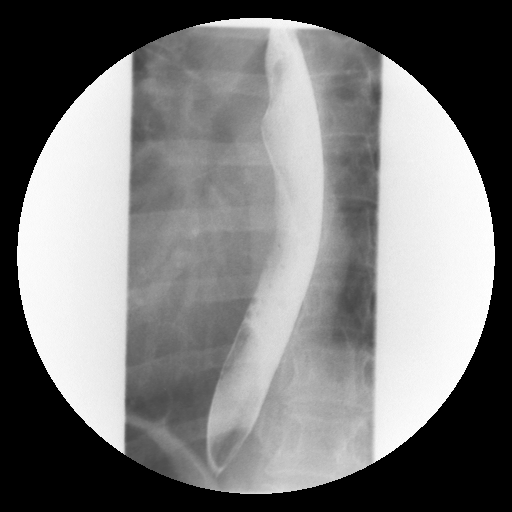

[Series 19: run · 1 of 1 slices shown (10 of 10)]
[im 1/1]
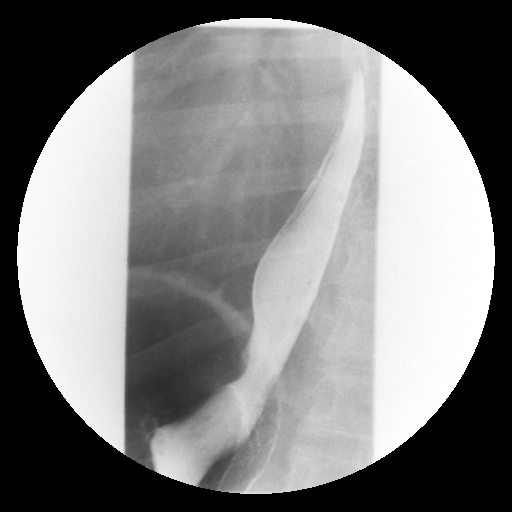

[12 of 24 positions shown; findings below may reference images not displayed]

FINDINGS: The patient had great difficulty initiating swallowing with the
thick barium solution, for example taking over 40 seconds to
initiate a single swallow. She indicated that the taste and texture
of the barium was disagreeable.

The pharyngeal phase of swallowing was otherwise normal.

Primary peristaltic waves in the esophagus were normal on [DATE]
swallows. No esophageal ulceration, irregularity, or narrowing.
Distal esophageal folds appear normal.

A 13 mm barium tablet passed without difficulty into the stomach.
IMPRESSION: 1. Other than 4 difficulty initiating swallowing which the patient
attributes to the disagreeable taste and texture of the barium,
today's exam was normal.

## 2015-07-02 NOTE — L&D Delivery Note (Signed)
Operative Delivery Note Pt pushed for 2+ hours with progress, but changed baseline to 90's.  D/W pt vaccuum extraction - inc r/b/a.  At 9:52 AM a viable and healthy female was delivered via Vaginal, Vacuum Investment banker, operational(Extractor).  Presentation: vertex; Position: Occiput,, Anterior; Station: +2/+3.  Verbal consent: obtained from patient.  Risks and benefits discussed in detail.  Risks include, but are not limited to the risks of anesthesia, bleeding, infection, damage to maternal tissues, fetal cephalhematoma.  There is also the risk of inability to effect vaginal delivery of the head, or shoulder dystocia that cannot be resolved by established maneuvers, leading to the need for emergency cesarean section.  APGAR: 8, 9; weight  .   Placenta status: Intact, Spontaneous.   Cord: 3 vessels with the following complications: True Knot.    Anesthesia: None  Instruments: Bell Vaccuum Episiotomy: None Lacerations: 2nd degree perineal;R Labial; Suture Repair: 3.0 vicryl rapide Est. Blood Loss (mL): 200cc  Mom to postpartum.  Baby to Couplet care / Skin to Skin.  Bovard-Stuckert, Lache Dagher 12/04/2015, 10:16 AM  Br/Bo; A+; [place]; [facility]; Contra?   Desires circumcision for female infant, d/w pt r/b/a - wish to proceed

## 2015-08-15 ENCOUNTER — Inpatient Hospital Stay (HOSPITAL_COMMUNITY): Admission: AD | Admit: 2015-08-15 | Payer: Self-pay | Source: Ambulatory Visit | Admitting: Obstetrics and Gynecology

## 2015-11-14 LAB — OB RESULTS CONSOLE GBS: GBS: POSITIVE

## 2015-12-04 ENCOUNTER — Encounter (HOSPITAL_COMMUNITY): Payer: Self-pay | Admitting: *Deleted

## 2015-12-04 ENCOUNTER — Inpatient Hospital Stay (HOSPITAL_COMMUNITY)
Admission: AD | Admit: 2015-12-04 | Discharge: 2015-12-06 | DRG: 775 | Disposition: A | Discharge status: Home / Self Care | Payer: BLUE CROSS/BLUE SHIELD | Source: Ambulatory Visit | Attending: Obstetrics and Gynecology | Admitting: Obstetrics and Gynecology

## 2015-12-04 DIAGNOSIS — O99824 Streptococcus B carrier state complicating childbirth: Secondary | ICD-10-CM | POA: Diagnosis present

## 2015-12-04 DIAGNOSIS — Z8759 Personal history of other complications of pregnancy, childbirth and the puerperium: Secondary | ICD-10-CM

## 2015-12-04 DIAGNOSIS — Z3A38 38 weeks gestation of pregnancy: Secondary | ICD-10-CM | POA: Diagnosis not present

## 2015-12-04 HISTORY — DX: Personal history of other complications of pregnancy, childbirth and the puerperium: Z87.59

## 2015-12-04 LAB — CBC
HCT: 42 % (ref 36.0–46.0)
Hemoglobin: 15 g/dL (ref 12.0–15.0)
MCH: 31.6 pg (ref 26.0–34.0)
MCHC: 35.7 g/dL (ref 30.0–36.0)
MCV: 88.4 fL (ref 78.0–100.0)
PLATELETS: 175 10*3/uL (ref 150–400)
RBC: 4.75 MIL/uL (ref 3.87–5.11)
RDW: 13.4 % (ref 11.5–15.5)
WBC: 12.7 10*3/uL — AB (ref 4.0–10.5)

## 2015-12-04 LAB — ABO/RH: ABO/RH(D): A POS

## 2015-12-04 LAB — RPR: RPR Ser Ql: NONREACTIVE

## 2015-12-04 MED ORDER — METHYLERGONOVINE MALEATE 0.2 MG/ML IJ SOLN
INTRAMUSCULAR | Status: AC
  Administered 2015-12-04: 0.2 mg via INTRAMUSCULAR
  Filled 2015-12-04: qty 3

## 2015-12-04 MED ORDER — ACETAMINOPHEN 325 MG PO TABS: 650.0000 mg | ORAL_TABLET | ORAL | Status: DC | PRN

## 2015-12-04 MED ORDER — IBUPROFEN 600 MG PO TABS
600.0000 mg | ORAL_TABLET | Freq: Four times a day (QID) | ORAL | Status: DC
  Administered 2015-12-04 – 2015-12-06 (×7): 600 mg via ORAL
  Filled 2015-12-04 (×8): qty 1

## 2015-12-04 MED ORDER — SOD CITRATE-CITRIC ACID 500-334 MG/5ML PO SOLN
30.0000 mL | ORAL | Status: DC | PRN
Start: 2015-12-04 — End: 2015-12-04

## 2015-12-04 MED ORDER — IBUPROFEN 600 MG PO TABS
600.0000 mg | ORAL_TABLET | Freq: Four times a day (QID) | ORAL | Status: DC | PRN
  Administered 2015-12-06: 600 mg via ORAL
  Filled 2015-12-04: qty 1

## 2015-12-04 MED ORDER — ONDANSETRON HCL 4 MG/2ML IJ SOLN: 4.0000 mg | INTRAMUSCULAR | Status: DC | PRN

## 2015-12-04 MED ORDER — PRENATAL MULTIVITAMIN CH
1.0000 | ORAL_TABLET | Freq: Every day | ORAL | Status: DC
  Administered 2015-12-05: 1 via ORAL
  Filled 2015-12-04 (×2): qty 1

## 2015-12-04 MED ORDER — MISOPROSTOL 200 MCG PO TABS
ORAL_TABLET | ORAL | Status: AC
  Filled 2015-12-04: qty 5

## 2015-12-04 MED ORDER — DOCUSATE SODIUM 100 MG PO CAPS
100.0000 mg | ORAL_CAPSULE | Freq: Two times a day (BID) | ORAL | Status: DC
  Administered 2015-12-04 – 2015-12-06 (×4): 100 mg via ORAL
  Filled 2015-12-04 (×5): qty 1

## 2015-12-04 MED ORDER — BUTORPHANOL TARTRATE 1 MG/ML IJ SOLN
1.0000 mg | INTRAMUSCULAR | Status: DC | PRN
  Administered 2015-12-04: 1 mg via INTRAVENOUS
  Filled 2015-12-04: qty 1

## 2015-12-04 MED ORDER — TERBUTALINE SULFATE 1 MG/ML IJ SOLN: 0.2500 mg | Freq: Once | INTRAMUSCULAR | Status: DC | PRN

## 2015-12-04 MED ORDER — OXYTOCIN BOLUS FROM INFUSION
500.0000 mL | INTRAVENOUS | Status: DC
  Administered 2015-12-04: 500 mL via INTRAVENOUS

## 2015-12-04 MED ORDER — OXYTOCIN 40 UNITS IN LACTATED RINGERS INFUSION - SIMPLE MED
INTRAVENOUS | Status: AC
  Filled 2015-12-04: qty 1000

## 2015-12-04 MED ORDER — FENTANYL CITRATE (PF) 100 MCG/2ML IJ SOLN
INTRAMUSCULAR | Status: AC
  Filled 2015-12-04: qty 4

## 2015-12-04 MED ORDER — FLEET ENEMA 7-19 GM/118ML RE ENEM: 1.0000 | ENEMA | RECTAL | Status: DC | PRN

## 2015-12-04 MED ORDER — ZOLPIDEM TARTRATE 5 MG PO TABS: 5.0000 mg | ORAL_TABLET | Freq: Every evening | ORAL | Status: DC | PRN

## 2015-12-04 MED ORDER — ONDANSETRON HCL 4 MG PO TABS: 4.0000 mg | ORAL_TABLET | ORAL | Status: DC | PRN

## 2015-12-04 MED ORDER — ONDANSETRON HCL 4 MG/2ML IJ SOLN: 4.0000 mg | Freq: Four times a day (QID) | INTRAMUSCULAR | Status: DC | PRN

## 2015-12-04 MED ORDER — DIBUCAINE 1 % RE OINT
1.0000 "application " | TOPICAL_OINTMENT | RECTAL | Status: DC | PRN
  Administered 2015-12-06: 1 via RECTAL
  Filled 2015-12-04: qty 28

## 2015-12-04 MED ORDER — COCONUT OIL OIL: 1.0000 "application " | TOPICAL_OIL | Status: DC | PRN

## 2015-12-04 MED ORDER — OXYTOCIN 40 UNITS IN LACTATED RINGERS INFUSION - SIMPLE MED
1.0000 m[IU]/min | INTRAVENOUS | Status: DC
  Administered 2015-12-04: 2 m[IU]/min via INTRAVENOUS

## 2015-12-04 MED ORDER — OXYCODONE-ACETAMINOPHEN 5-325 MG PO TABS
1.0000 | ORAL_TABLET | ORAL | Status: DC | PRN
Start: 2015-12-04 — End: 2015-12-04

## 2015-12-04 MED ORDER — DIPHENHYDRAMINE HCL 25 MG PO CAPS: 25.0000 mg | ORAL_CAPSULE | Freq: Four times a day (QID) | ORAL | Status: DC | PRN

## 2015-12-04 MED ORDER — BENZOCAINE-MENTHOL 20-0.5 % EX AERO
1.0000 | INHALATION_SPRAY | CUTANEOUS | Status: DC | PRN
Start: 2015-12-04 — End: 2015-12-06
  Administered 2015-12-04: 1 via TOPICAL
  Filled 2015-12-04: qty 56

## 2015-12-04 MED ORDER — LACTATED RINGERS IV SOLN
INTRAVENOUS | Status: DC
  Administered 2015-12-04: 13:00:00 via INTRAVENOUS

## 2015-12-04 MED ORDER — LIDOCAINE HCL (PF) 1 % IJ SOLN
30.0000 mL | INTRAMUSCULAR | Status: DC | PRN
  Administered 2015-12-04: 30 mL via SUBCUTANEOUS
  Filled 2015-12-04: qty 30

## 2015-12-04 MED ORDER — SODIUM CHLORIDE 0.9 % IV SOLN
2.0000 g | Freq: Once | INTRAVENOUS | Status: AC
  Administered 2015-12-04: 2 g via INTRAVENOUS
  Filled 2015-12-04: qty 2000

## 2015-12-04 MED ORDER — OXYTOCIN 40 UNITS IN LACTATED RINGERS INFUSION - SIMPLE MED
2.5000 [IU]/h | INTRAVENOUS | Status: DC
  Filled 2015-12-04: qty 1000

## 2015-12-04 MED ORDER — WITCH HAZEL-GLYCERIN EX PADS
1.0000 "application " | MEDICATED_PAD | CUTANEOUS | Status: DC | PRN
  Administered 2015-12-06: 1 via TOPICAL

## 2015-12-04 MED ORDER — SENNOSIDES-DOCUSATE SODIUM 8.6-50 MG PO TABS
2.0000 | ORAL_TABLET | ORAL | Status: DC
  Administered 2015-12-04 – 2015-12-06 (×2): 2 via ORAL
  Filled 2015-12-04 (×2): qty 2

## 2015-12-04 MED ORDER — OXYCODONE-ACETAMINOPHEN 5-325 MG PO TABS
2.0000 | ORAL_TABLET | ORAL | Status: DC | PRN
Start: 2015-12-04 — End: 2015-12-04

## 2015-12-04 MED ORDER — CARBOPROST TROMETHAMINE 250 MCG/ML IM SOLN
INTRAMUSCULAR | Status: AC
  Filled 2015-12-04: qty 1

## 2015-12-04 MED ORDER — LACTATED RINGERS IV SOLN
INTRAVENOUS | Status: DC
  Administered 2015-12-04: 05:00:00 via INTRAVENOUS

## 2015-12-04 MED ORDER — LACTATED RINGERS IV SOLN: 500.0000 mL | INTRAVENOUS | Status: DC | PRN

## 2015-12-04 MED ORDER — SIMETHICONE 80 MG PO CHEW: 80.0000 mg | CHEWABLE_TABLET | ORAL | Status: DC | PRN

## 2015-12-04 NOTE — MAU Note (Signed)
Pt reports ROM at 0330, contractions 

## 2015-12-04 NOTE — Progress Notes (Signed)
Patient had 2 golf ball sized clots noted on assessment and stated that she needed to void. Assisted patient to bathroom where she became "dizzy". Emergency light pulled and patient passed out while sitting on toilet. Other staff RN's and techs came and we took patient back to bed with stedy as she was alert at that time again. She was unable to void. Fundal assessment completed with many clots noted. See vital signs. Code Hem called.

## 2015-12-04 NOTE — Progress Notes (Signed)
Patient ID: Natasha Carpenter, female   DOB: 08-04-1980, 35 y.o.   MRN: [id number]  Pt with PP Hmg - up to RR, passed clots and LOC Had lost 870cc - still with trickle LUS explored - 350 cc clots expressed  Pt given methergine 0.2mg   Will monitor closely Emptied 400cc in I&O cath  Stage 2 PP Hmg

## 2015-12-04 NOTE — Lactation Note (Signed)
This note was copied from a baby's chart. Lactation Consultation Note  Patient Name: Natasha StarksBoy [name] ZOXWR'UToday's Date: 12/04/2015 Reason for consult: Initial assessment Baby at 11 hr of life and was transferred to the NICU. Mom had the DEBP set up upon entry. She was using the 24 mm flange, moved up to 30 mm flange. Discussed pumping frequency, storing/labeling/transporting breast milk, breast changes, and nipple care. Demonstrated manual expression, colostrum noted bilaterally. Given lactation handouts. Aware of OP services and support group.     Maternal Data Has patient been taught Hand Expression?: Yes  Feeding    LATCH Score/Interventions                      Lactation Tools Discussed/Used WIC Program: No Pump Review: Setup, frequency, and cleaning;Milk Storage Date initiated:: 12/04/15   Consult Status Consult Status: Follow-up Date: 12/05/15 Follow-up type: In-patient    Rulon Eisenmengerlizabeth E Shedric Fredericks 12/04/2015, 8:57 PM

## 2015-12-04 NOTE — Progress Notes (Signed)
Patient ID: Natasha Carpenter, female   DOB: 10-24-1980, 35 y.o.   MRN: [id number]  Pt c/c/+1, some pressure  FHTs 125, good var, category 1-2 toco q 3-4  Will start pushing soon anticipate SVD

## 2015-12-04 NOTE — H&P (Signed)
Natasha Carpenter is a 35 y.o. female, G1P0, EGA 38+ weeks with [facility] presenting for leaking fluid and ctx.  On eval in MAU VE 7 cm, membranes not palpated.  Prenatal care complicated by IVF conception, Panorama low risk female.  Maternal Medical History:  Reason for admission: Rupture of membranes and contractions.   Contractions: Frequency: regular.   Perceived severity is moderate.    Fetal activity: Perceived fetal activity is normal.    Prenatal complications: no prenatal complications   OB History    Gravida Para Term Preterm AB TAB SAB Ectopic Multiple Living   1              History reviewed. No pertinent past medical history. Past Surgical History  Procedure Laterality Date  . Laparoscopy N/A 04/22/2013    Procedure: Diagnostic Operative Laparoscopy, Lysis of Adhesions, Drainage of Endometriomas;  Surgeon: Natasha SellerJennifer M Ozan, Natasha Carpenter;  Location: [facility];  Service: Gynecology;  Laterality: N/A;  . Wisdom tooth extraction     Family History: family history is not on file. Social History:  reports that she has never smoked. She has never used smokeless tobacco. She reports that she does not drink alcohol or use illicit drugs.   Prenatal Transfer Tool  Maternal Diabetes: No Genetic Screening: Normal Maternal Ultrasounds/Referrals: Normal Fetal Ultrasounds or other Referrals:  None Maternal Substance Abuse:  No Significant Maternal Medications:  None Significant Maternal Lab Results:  Lab values include: Group B Strep positive Other Comments:  None  Review of Systems  Respiratory: Negative.   Cardiovascular: Negative.     Dilation: 9 Effacement (%): 100 Station: +1 Exam by:: Natasha Carpenter Blood pressure 134/89, pulse 70, temperature 97.8 F (36.6 C), temperature source Oral, resp. rate 20, height 4\' 11"  (1.499 m), weight 63.957 kg (141 lb), SpO2 100 %. Maternal Exam:  Uterine Assessment: Contraction strength is moderate.  Contraction frequency is regular.   Abdomen: Estimated fetal  weight is 7 lbs.   Fetal presentation: vertex  Introitus: Normal vulva. Normal vagina.  Amniotic fluid character: clear.  Pelvis: adequate for delivery.   Cervix: Cervix evaluated by digital exam.     Fetal Exam Fetal Monitor Review: Mode: ultrasound.   Baseline rate: 140.  Variability: moderate (6-25 bpm).   Pattern: accelerations present and early decelerations.    Fetal State Assessment: Category I - tracings are normal.     Physical Exam  Vitals reviewed. Constitutional: She appears well-developed and well-nourished.  Cardiovascular: Normal rate, regular rhythm and normal heart sounds.   No murmur heard. Respiratory: Effort normal and breath sounds normal. No respiratory distress. She has no wheezes.  GI: Soft.    Prenatal labs: ABO, Rh: --/--/A POS (06/05 [id number]) Antibody: NEG (06/05 0456) Rubella: Immune (11/28 0000) RPR: Nonreactive (11/28 0000)  HBsAg: Negative (11/28 0000)  HIV: Non-reactive (11/28 0000)  GBS: Positive (05/16 0000)   Assessment/Plan: IUP at 38+ weeks in active labor, +GBS.  Has received Ampicillin, monitor progress and anticipate SVD.   Natasha Carpenter D 12/04/2015, 6:55 AM

## 2015-12-05 LAB — CBC
HEMATOCRIT: 25.9 % — AB (ref 36.0–46.0)
Hemoglobin: 8.9 g/dL — ABNORMAL LOW (ref 12.0–15.0)
MCH: 31 pg (ref 26.0–34.0)
MCHC: 34.4 g/dL (ref 30.0–36.0)
MCV: 90.2 fL (ref 78.0–100.0)
PLATELETS: 148 10*3/uL — AB (ref 150–400)
RBC: 2.87 MIL/uL — AB (ref 3.87–5.11)
RDW: 13.9 % (ref 11.5–15.5)
WBC: 12.2 10*3/uL — AB (ref 4.0–10.5)

## 2015-12-05 NOTE — Progress Notes (Signed)
Post Partum Day 1 Subjective: no complaints and tolerating PO  Baby in NICU for respiratory issues, but stable.  On decreasing O2 and antibiotics  Objective: Blood pressure 119/60, pulse 88, temperature 98.4 F (36.9 C), temperature source Oral, resp. rate 19, height 4\' 11"  (1.499 m), weight 141 lb (63.957 kg), SpO2 97 %, unknown if currently breastfeeding.  Physical Exam:  General: alert and cooperative Lochia: appropriate Uterine Fundus: firm    Recent Labs  12/04/15 0456 12/05/15 0539  HGB 15.0 8.9*  HCT 42.0 25.9*    Assessment/Plan: Plan for discharge tomorrow  Pumping going well, visiting baby in NICU   LOS: 1 day   Anelis Hrivnak W 12/05/2015, 12:37 PM

## 2015-12-05 NOTE — Progress Notes (Signed)
CSW acknowledges NICU admission.    Patient screened out for psychosocial assessment since none of the following apply:  Psychosocial stressors documented in mother or baby's chart  Gestation less than 32 weeks  Code at delivery   Infant with anomalies  Please contact the Clinical Social Worker if specific needs arise, or by MOB's request.       

## 2015-12-06 MED ORDER — IBUPROFEN 600 MG PO TABS: 600.0000 mg | ORAL_TABLET | Freq: Four times a day (QID) | ORAL | Status: AC | PRN

## 2015-12-06 NOTE — Discharge Summary (Signed)
    OB Discharge Summary     Patient Name: Natasha AmenHollie Carpenter DOB: 1980-12-09 MRN: [id number]  Date of admission: 12/04/2015 Delivering MD: Sherian ReinBOVARD-STUCKERT, JODY   Date of discharge: 12/06/2015  Admitting diagnosis: 39 WEEKS ROM Intrauterine pregnancy: 369w6d     Secondary diagnosis:  Principal Problem:   Status post vacuum-assisted vaginal delivery Active Problems:   Indication for care in labor or delivery      Discharge diagnosis: Term Pregnancy Delivered                                   Hospital course:  Onset of Labor With Vaginal Delivery     35 y.o. yo G1P1001 at 599w6d was admitted in Active Labor on 12/04/2015. Patient had an uncomplicated labor course as follows:  Membrane Rupture Time/Date: 3:20 AM ,12/04/2015   Intrapartum Procedures: Episiotomy: None [1]                                         Lacerations:  2nd degree [3];Labial [10];Perineal [11]  Patient had a delivery of a Viable infant. 12/04/2015  Information for the patient's newborn:  [facility] [[id number]][[id number]]  Delivery Method: Vaginal, Vacuum (Extractor) (Filed from Delivery Summary)    Pateint had an uncomplicated postpartum course.  She is ambulating, tolerating a regular diet, passing flatus, and urinating well. Patient is discharged home in stable condition on 12/06/2015.    Physical exam  Filed Vitals:   12/04/15 2000 12/05/15 0535 12/05/15 1700 12/06/15 0530  BP: 123/71 119/60 111/63 105/57  Pulse: 111 88 98 88  Temp: 98 F (36.7 C) 98.4 F (36.9 C) 98.2 F (36.8 C) 98.3 F (36.8 C)  TempSrc: Oral Oral Oral Oral  Resp: 20 19 18 18   Height:      Weight:      SpO2: 97%      General: alert Lochia: appropriate Uterine Fundus: firm  Labs: Lab Results  Component Value Date   WBC 12.2* 12/05/2015   HGB 8.9* 12/05/2015   HCT 25.9* 12/05/2015   MCV 90.2 12/05/2015   PLT 148* 12/05/2015   No flowsheet data found.  Discharge instruction: per After Visit Summary and "Baby and Me Booklet".  After visit  meds:    Medication List    TAKE these medications        ibuprofen 600 MG tablet  Commonly known as:  ADVIL,MOTRIN  Take 1 tablet (600 mg total) by mouth every 6 (six) hours as needed for moderate pain.     levothyroxine 50 MCG tablet  Commonly known as:  SYNTHROID, LEVOTHROID  Take 50 mcg by mouth daily.     multivitamin-prenatal 27-0.8 MG Tabs tablet  Take 1 tablet by mouth daily at 12 noon.        Diet: routine diet  Activity: Advance as tolerated. Pelvic rest for 6 weeks.   Outpatient follow up:6 weeks   Newborn Data: Live born female  Birth Weight: 7 lb 2.8 oz (3255 g) APGAR: 8, 9  Baby Feeding: Breast Disposition:NICU   12/06/2015 Zenaida NieceMEISINGER,Tyrek Lawhorn D, MD

## 2015-12-06 NOTE — Discharge Instructions (Signed)
As per discharge pamphlet °

## 2015-12-06 NOTE — Progress Notes (Signed)
PPD #2 Doing well, baby stable in NICU Afeb, VSS Fundus firm D/c home 

## 2015-12-06 NOTE — Lactation Note (Signed)
This note was copied from a baby's chart. Lactation Consultation Note  Patient Name: Natasha Carpenter XLKGM'WVersie Starksoday's Date: 12/06/2015 Reason for consult: Follow-up assessment;NICU baby  NICU baby 7047 hours old. Mom reports that she has been pumping every 2-3 hours but is not seeing any colostrum. Assisted mom to hand express and mom able to return-demonstrate hand expression. After 10 minutes of hand expression, mom able to produce a slight bit of moisture at left nipple. Mom confirms that she did pass a number of clots, and a PPH was called. Discussed with mom that blood loss can impact breast milk production. Enc mom to continue pumping every 2-3 hours followed by hand expression. Mom states that she has a personal DEBP. Discussed benefits of hospital-grade pump and mom aware of rental if she decides to use. Mom aware of pumping rooms in NICU with hospital-grade pump, OP/BFSG and LC phone line assistance after D/C. Discussed with mom the need to keep supplementing baby even after baby D/C'd home. Also answered questions regarding nursing/pumping and returning to work.  Maternal Data    Feeding Feeding Type: Formula Nipple Type: Slow - flow Length of feed: 15 min  LATCH Score/Interventions                      Lactation Tools Discussed/Used     Consult Status Consult Status: PRN    Natasha Carpenter, Natasha Carpenter 12/06/2015, 9:36 AM

## 2015-12-08 LAB — TYPE AND SCREEN
ABO/RH(D): A POS
Antibody Screen: NEGATIVE
UNIT DIVISION: 0
Unit division: 0

## 2019-06-07 ENCOUNTER — Other Ambulatory Visit (HOSPITAL_COMMUNITY)
Admission: RE | Admit: 2019-06-07 | Discharge: 2019-06-07 | Disposition: A | Discharge status: Home / Self Care | Payer: BLUE CROSS/BLUE SHIELD | Source: Ambulatory Visit | Attending: Family Medicine | Admitting: Family Medicine

## 2019-06-07 ENCOUNTER — Other Ambulatory Visit: Payer: Self-pay | Admitting: Family Medicine

## 2019-06-07 DIAGNOSIS — Z124 Encounter for screening for malignant neoplasm of cervix: Secondary | ICD-10-CM | POA: Insufficient documentation

## 2019-06-10 LAB — CYTOLOGY - PAP
Comment: NEGATIVE
Diagnosis: NEGATIVE
High risk HPV: NEGATIVE

## 2022-03-18 ENCOUNTER — Telehealth: Payer: Self-pay | Admitting: Internal Medicine

## 2022-03-18 NOTE — Telephone Encounter (Signed)
Scheduled appt per 9/15 referral. Pt is aware of appt date and time. Pt is aware to arrive 15 mins prior to appt time and to bring and updated insurance card. Pt is aware of appt location.   

## 2022-04-03 ENCOUNTER — Inpatient Hospital Stay: Payer: Self-pay

## 2022-04-03 ENCOUNTER — Other Ambulatory Visit: Payer: Self-pay

## 2022-04-03 ENCOUNTER — Inpatient Hospital Stay: Payer: Managed Care, Other (non HMO) | Attending: Internal Medicine

## 2022-04-03 ENCOUNTER — Inpatient Hospital Stay (HOSPITAL_BASED_OUTPATIENT_CLINIC_OR_DEPARTMENT_OTHER): Payer: Managed Care, Other (non HMO) | Admitting: Nurse Practitioner

## 2022-04-03 ENCOUNTER — Encounter: Payer: Self-pay | Admitting: Nurse Practitioner

## 2022-04-03 ENCOUNTER — Inpatient Hospital Stay: Payer: Self-pay | Admitting: Internal Medicine

## 2022-04-03 VITALS — BP 126/81 | HR 101 | Temp 98.3°F | Resp 18 | Ht 60.0 in | Wt 113.9 lb

## 2022-04-03 DIAGNOSIS — D509 Iron deficiency anemia, unspecified: Secondary | ICD-10-CM | POA: Insufficient documentation

## 2022-04-03 DIAGNOSIS — D508 Other iron deficiency anemias: Secondary | ICD-10-CM | POA: Diagnosis not present

## 2022-04-03 DIAGNOSIS — N8000 Endometriosis of the uterus, unspecified: Secondary | ICD-10-CM | POA: Insufficient documentation

## 2022-04-03 DIAGNOSIS — E039 Hypothyroidism, unspecified: Secondary | ICD-10-CM | POA: Diagnosis not present

## 2022-04-03 LAB — CBC WITH DIFFERENTIAL (CANCER CENTER ONLY)
Abs Immature Granulocytes: 0.02 10*3/uL (ref 0.00–0.07)
Basophils Absolute: 0 10*3/uL (ref 0.0–0.1)
Basophils Relative: 0 %
Eosinophils Absolute: 0 10*3/uL (ref 0.0–0.5)
Eosinophils Relative: 1 %
HCT: 39.8 % (ref 36.0–46.0)
Hemoglobin: 12.2 g/dL (ref 12.0–15.0)
Immature Granulocytes: 0 %
Lymphocytes Relative: 36 %
Lymphs Abs: 2.2 10*3/uL (ref 0.7–4.0)
MCH: 22.6 pg — ABNORMAL LOW (ref 26.0–34.0)
MCHC: 30.7 g/dL (ref 30.0–36.0)
MCV: 73.6 fL — ABNORMAL LOW (ref 80.0–100.0)
Monocytes Absolute: 0.4 10*3/uL (ref 0.1–1.0)
Monocytes Relative: 6 %
Neutro Abs: 3.6 10*3/uL (ref 1.7–7.7)
Neutrophils Relative %: 57 %
Platelet Count: 391 10*3/uL (ref 150–400)
RBC: 5.41 MIL/uL — ABNORMAL HIGH (ref 3.87–5.11)
RDW: 23.7 % — ABNORMAL HIGH (ref 11.5–15.5)
WBC Count: 6.2 10*3/uL (ref 4.0–10.5)
nRBC: 0 % (ref 0.0–0.2)

## 2022-04-03 LAB — IRON AND IRON BINDING CAPACITY (CC-WL,HP ONLY)
Iron: 22 ug/dL — ABNORMAL LOW (ref 28–170)
Saturation Ratios: 4 % — ABNORMAL LOW (ref 10.4–31.8)
TIBC: 570 ug/dL — ABNORMAL HIGH (ref 250–450)
UIBC: 548 ug/dL — ABNORMAL HIGH (ref 148–442)

## 2022-04-03 LAB — FERRITIN: Ferritin: 7 ng/mL — ABNORMAL LOW (ref 11–307)

## 2022-04-03 NOTE — Progress Notes (Cosign Needed)
[facility]   Telephone:([phone] Fax:(336) [phone]   Clinic New consult Note   Patient Care Team: Gretel Acre, MD as PCP - General (Family Medicine) 04/03/2022  CHIEF COMPLAINTS/PURPOSE OF CONSULTATION:  Iron deficiency anemia, referred by PCP  HISTORY OF PRESENTING ILLNESS:  Pierre Hartney 41 y.o. female with endometriosis no significant past medical history is here because of anemia. She had a normal CBC in 03/2013, next CBC 12/05/15 showed moderate anemia hgb 8.9 and plt 148 during deliver of her child. There is a gap in her labs until PCP work up 03/08/22 CBC showing Hgb 9.7, MCV 67.2, RDW 18.5, plt 504 and normal differential.  Creatinine and thyroid are normal.  Repeat labs 03/13/2022 again shows Hgb 9.8, MCV 66.8, platelet 498.  Iron panel shows ferritin 3, TIBC 568, serum iron 22, 4% saturation, and transferrin of 406.  B12 level in low range normal at 248, folate normal at 17.  She reportedly gave a stool sample which was negative for blood.  Began OTC iron chewable BID approx 3 weeks ago, tolerating well. Denies h/o abdominal or weight loss surgeries. Denies heavy NSAID or antiplatelet use, blood donation, h/o cancer, or pica. She has not had mammogram or endoscopies. She eats a normal diet but less meat/more salads recently trying to control her cholesterol.  Socially she is married, G1P1 through IVF premenopausal female originally from [place] and moved here in '95.  Periods are regular monthly lasting 3 days, not heavy.  She is working as an Airline pilot.  Independent with ADLs.  Denies alcohol, tobacco, or drug use.  Denies family history of cancer, blood disorder, or anemia.  Today she presents by herself.  She feels much better since starting oral iron 3 weeks ago, takes twice daily.  Fatigue and dizziness have improved.  Denies constipation, black/bloody stool, nausea/vomiting, abdominal pain, fever, night sweats, unintentional weight loss.    MEDICAL HISTORY:  Past  Medical History:  Diagnosis Date   Status post vacuum-assisted vaginal delivery 12/04/2015    SURGICAL HISTORY: Past Surgical History:  Procedure Laterality Date   LAPAROSCOPY N/A 04/22/2013   Procedure: Diagnostic Operative Laparoscopy, Lysis of Adhesions, Drainage of Endometriomas;  Surgeon: Sharon Seller, DO;  Location: [facility];  Service: Gynecology;  Laterality: N/A;   WISDOM TOOTH EXTRACTION      SOCIAL HISTORY: Social History   Socioeconomic History   Marital status: Married    Spouse name: Not on file   Number of children: Not on file   Years of education: Not on file   Highest education level: Not on file  Occupational History   Not on file  Tobacco Use   Smoking status: Never   Smokeless tobacco: Never  Vaping Use   Vaping Use: Never used  Substance and Sexual Activity   Alcohol use: No   Drug use: No   Sexual activity: Not on file  Other Topics Concern   Not on file  Social History Narrative   Not on file   Social Determinants of Health   Financial Resource Strain: Not on file  Food Insecurity: Not on file  Transportation Needs: Not on file  Physical Activity: Not on file  Stress: Not on file  Social Connections: Not on file  Intimate Partner Violence: Not on file    FAMILY HISTORY: Family History  Problem Relation Age of Onset   Cancer Neg Hx     ALLERGIES:  has No Known Allergies.  MEDICATIONS:  Current Outpatient Medications  Medication Sig Dispense Refill   ibuprofen (ADVIL,MOTRIN) 600 MG tablet Take 1 tablet (600 mg total) by mouth every 6 (six) hours as needed for moderate pain. 30 tablet 0   levothyroxine (SYNTHROID, LEVOTHROID) 50 MCG tablet Take 50 mcg by mouth daily.  5   Prenatal Vit-Fe Fumarate-FA (MULTIVITAMIN-PRENATAL) 27-0.8 MG TABS tablet Take 1 tablet by mouth daily at 12 noon.     No current facility-administered medications for this visit.    REVIEW OF SYSTEMS:   Constitutional: Denies fevers, chills, unintentional weight  loss or abnormal night sweats (+) fatigue Eyes: Denies blurriness of vision, double vision or watery eyes Ears, nose, mouth, throat, and face: Denies mucositis or sore throat Respiratory: Denies cough, dyspnea or wheezes Cardiovascular: Denies palpitation, chest discomfort or lower extremity swelling Gastrointestinal:  Denies nausea, vomiting, constipation, diarrhea, hematochezia, heartburn or change in bowel habits GYN (+) uterine fibroids (+) endometriosis Skin: Denies abnormal skin rashes Lymphatics: Denies new lymphadenopathy or easy bruising Neurological:Denies numbness, tingling or new weaknesses (+) dizziness Behavioral/Psych: Mood is stable, no new changes  All other systems were reviewed with the patient and are negative.  PHYSICAL EXAMINATION: ECOG PERFORMANCE STATUS: 0 - Asymptomatic  Vitals:   04/03/22 1130  BP: 126/81  Pulse: (!) 101  Resp: 18  Temp: 98.3 F (36.8 C)  SpO2: 100%   Filed Weights   04/03/22 1130  Weight: 113 lb 14.4 oz (51.7 kg)    GENERAL:alert, no distress and comfortable SKIN: No rash EYES: sclera clear NECK: Without mass LYMPH:  no palpable cervical or supraclavicular lymphadenopathy  LUNGS: clear with normal breathing effort HEART: regular rate & rhythm, no lower extremity edema ABDOMEN: abdomen soft, non-tender and normal bowel sounds Musculoskeletal:no cyanosis of digits and no clubbing  PSYCH: alert & oriented x 3 with fluent speech NEURO: no focal motor/sensory deficits  LABORATORY DATA:  I have reviewed the data as listed    Latest Ref Rng & Units 04/03/2022   12:15 PM 12/05/2015    5:39 AM 12/04/2015    4:56 AM  CBC  WBC 4.0 - 10.5 K/uL 6.2  12.2  12.7   Hemoglobin 12.0 - 15.0 g/dL 85.4  8.9  62.7   Hematocrit 36.0 - 46.0 % 39.8  25.9  42.0   Platelets 150 - 400 K/uL 391  148  175      RADIOGRAPHIC STUDIES: I have personally reviewed the radiological images as listed and agreed with the findings in the report. No results  found.  ASSESSMENT & PLAN: 41 year old premenopausal female from [place]  Iron deficiency anemia and thrombocytosis -We reviewed her medical record in detail with the patient.  She had a normal CBC in 2014 and became anemic 11/2015 with the delivery of her son, Hgb 8.9.  Subsequent labs are not available -Presented to PCP with symptomatic anemia, with fatigue and dizziness in 03/2022, labs showed Hgb 9.8, microcytosis MCV 66, thrombocytosis platelet 504, and iron panel consistent with IDA with ferritin 3, TIBC 568, serum iron 22, and 4% saturation -Further testing showed low-range normal B12 248, normal folic acid and TSH.  No history of CKD or other chronic conditions.  The fact that she had a normal MCV in the past excludes thalassemia -Work-up is consistent with iron deficiency anemia.  She reportedly had a negative stool test but has not had EGD/colonoscopy.  She denies menorrhagia.  She has been eating less meat to control cholesterol.  The source of her IDA is likely poor dietary iron intake.  -  She began oral iron twice daily approximately 3 weeks ago, symptoms have improved. -Today's labs show normal hemoglobin 12.2 and platelet 391, iron/TIBC and ferritin are pending.  She appears to have responded well to a short course of oral iron already.  Continue oral iron, take with Vitamin C to promote absorption, and increase Fe rick diet.  -We will follow-up on the pending iron and ferritin levels from today to determine if she needs IV iron  -Given her low range normal B12 I also recommended to start oral B12 p.o. once daily -If she has recurrent/worsening iron deficiency in the future, recommend to proceed with GI work-up to rule out bleeding source and check celiac panel to rule out malabsorption -Ms. Sean lives in [facility] and prefers to follow-up closer to home, we have referred her to Dr. Marin Olp for further management -Pt seen with Dr. Burr Medico  Fatigue and dizziness -Secondary to IDA -Improved  on oral iron  Hypothyroidism, endometriosis, and health maintenance -I reviewed age-appropriate cancer screenings and encouraged her to begin mammogram this year -Continue follow-up with PCP and OB/GYN  Plan: -Medical record reviewed -Continue oral iron 1-2 times daily with vitamin C and begin oral B12 once daily -Iron rich diet -Lab today, iron/TIBC and ferritin are pending, will arrange IV iron if needed -Referral to [place] per patient request for further management   Orders Placed This Encounter  Procedures   CBC with Differential ([place] Only)    Standing Status:   Future    Number of Occurrences:   1    Standing Expiration Date:   04/04/2023   Ferritin    Standing Status:   Future    Number of Occurrences:   1    Standing Expiration Date:   04/04/2023   Iron and Iron Binding Capacity ([facility] only)    Standing Status:   Future    Number of Occurrences:   1    Standing Expiration Date:   04/04/2023   Celiac panel 10    Standing Status:   Future    Standing Expiration Date:   04/03/2023   Occult blood card to lab, stool    Standing Status:   Future    Standing Expiration Date:   04/04/2023   Occult blood card to lab, stool    Standing Status:   Future    Standing Expiration Date:   04/04/2023   Occult blood card to lab, stool    Standing Status:   Future    Standing Expiration Date:   04/04/2023   Ambulatory referral to Hematology / Oncology    Referral Priority:   Routine    Referral Type:   Consultation    Referral Reason:   Specialty Services Required    Requested Specialty:   Oncology    Number of Visits Requested:   1     All questions were answered. The patient knows to call the clinic with any problems, questions or concerns.     Alla Feeling, NP 04/03/22   Addendum I have seen the patient, examined her. I agree with the assessment and and plan and have edited the notes.   41 yo female with PMH of endometriosis, who was referred  for anemia.  She has had anemia for 6 years, outside lab showed evidence of iron deficiency and mild B12 deficiency.  She has been on oral iron for 3 weeks, we will repeat her lab today to see if her anemia has  improved.  We discussed option of IV iron if needed.  Her iron deficiency is related to her menstrual period, although it's not heavy. I encouraged to have repeated stool OB.  We will also obtain celiac panel.  She will start oral B12. She lives in [facility], will refer her to Dr. Gustavo Lah office for follow-up.  Questions were answered.  Malachy Mood  04/03/2022

## 2022-04-11 ENCOUNTER — Telehealth: Payer: Self-pay

## 2022-04-11 NOTE — Telephone Encounter (Signed)
-----   Message from Alla Feeling, NP sent at 04/11/2022  1:53 PM EDT ----- Please let pt know her anemia resolved on oral iron after 1 month therapy, but she still has low iron. If she feels well, its reasonable to continue oral iron for at least 3 months total. If she is still symptomatic we could do IV iron while she waits for her consult in [facility]. I reached out to the NP she will see, but haven't heard back. Let me know which she prefers.   Thanks, Regan Rakers, NP

## 2022-04-11 NOTE — Telephone Encounter (Signed)
Called patient, per Cira Rue, NP her anemia has resolved on oral iron after 1 month but her iron is still low. This LPN asked the patient if she felt well enough to continue oral iron for 3 months total or would she like to try IV iron if she isn't feeling well. The patient stated she will keep taking the pills for 3 months total. No further questions or concerns.

## 2022-04-23 ENCOUNTER — Telehealth: Payer: Self-pay | Admitting: *Deleted

## 2022-04-23 NOTE — Telephone Encounter (Signed)
Spoke with patient and advised her that since she feels like she doesn't need to be seen here anymore that we would recommend that she have a routine CBC and Iron panel done by her PCP to keep a check on her levels.  She stated understanding.  No further questions at this time.

## 2022-04-24 ENCOUNTER — Inpatient Hospital Stay: Payer: Managed Care, Other (non HMO)

## 2022-04-24 ENCOUNTER — Inpatient Hospital Stay: Payer: Managed Care, Other (non HMO) | Admitting: Family

## 2022-07-02 ENCOUNTER — Telehealth: Payer: Self-pay | Admitting: Internal Medicine

## 2022-07-02 NOTE — Telephone Encounter (Signed)
Called patient per 12/28 in basket. Patient notified of new appointment.

## 2022-08-06 ENCOUNTER — Other Ambulatory Visit: Payer: Self-pay | Admitting: Nurse Practitioner

## 2022-08-06 DIAGNOSIS — D508 Other iron deficiency anemias: Secondary | ICD-10-CM

## 2022-08-07 ENCOUNTER — Inpatient Hospital Stay: Payer: Managed Care, Other (non HMO) | Attending: Nurse Practitioner

## 2022-08-07 ENCOUNTER — Other Ambulatory Visit: Payer: Self-pay

## 2022-08-07 DIAGNOSIS — D509 Iron deficiency anemia, unspecified: Secondary | ICD-10-CM | POA: Insufficient documentation

## 2022-08-07 DIAGNOSIS — D508 Other iron deficiency anemias: Secondary | ICD-10-CM

## 2022-08-07 LAB — CBC WITH DIFFERENTIAL (CANCER CENTER ONLY)
Abs Immature Granulocytes: 0.01 10*3/uL (ref 0.00–0.07)
Basophils Absolute: 0 10*3/uL (ref 0.0–0.1)
Basophils Relative: 0 %
Eosinophils Absolute: 0.1 10*3/uL (ref 0.0–0.5)
Eosinophils Relative: 2 %
HCT: 41.5 % (ref 36.0–46.0)
Hemoglobin: 14 g/dL (ref 12.0–15.0)
Immature Granulocytes: 0 %
Lymphocytes Relative: 52 %
Lymphs Abs: 2.3 10*3/uL (ref 0.7–4.0)
MCH: 29.5 pg (ref 26.0–34.0)
MCHC: 33.7 g/dL (ref 30.0–36.0)
MCV: 87.6 fL (ref 80.0–100.0)
Monocytes Absolute: 0.2 10*3/uL (ref 0.1–1.0)
Monocytes Relative: 5 %
Neutro Abs: 1.9 10*3/uL (ref 1.7–7.7)
Neutrophils Relative %: 41 %
Platelet Count: 331 10*3/uL (ref 150–400)
RBC: 4.74 MIL/uL (ref 3.87–5.11)
RDW: 13.1 % (ref 11.5–15.5)
WBC Count: 4.5 10*3/uL (ref 4.0–10.5)
nRBC: 0 % (ref 0.0–0.2)

## 2022-08-07 LAB — FERRITIN: Ferritin: 22 ng/mL (ref 11–307)

## 2022-08-07 LAB — IRON AND IRON BINDING CAPACITY (CC-WL,HP ONLY)
Iron: 90 ug/dL (ref 28–170)
Saturation Ratios: 22 % (ref 10.4–31.8)
TIBC: 403 ug/dL (ref 250–450)
UIBC: 313 ug/dL (ref 148–442)

## 2022-08-08 ENCOUNTER — Telehealth: Payer: Self-pay

## 2022-08-08 NOTE — Telephone Encounter (Addendum)
Called patient to relay message below, patient had full understanding    ----- Message from Alla Feeling, NP sent at 08/08/2022  1:48 PM EST ----- Please call patient, let her know IDA resolved, labs are normal now. She can stop oral iron. I will check lab in 3 and 6 months, then see her in 6 months. Please have her call us sooner if she develops s/sx of recurrent anemia.   Thanks, Regan Rakers NP

## 2022-09-19 ENCOUNTER — Telehealth: Payer: Self-pay | Admitting: Nurse Practitioner

## 2022-09-19 NOTE — Telephone Encounter (Signed)
Per 3/21 IB reached out to patient to rescehdule

## 2022-11-11 ENCOUNTER — Other Ambulatory Visit: Payer: Managed Care, Other (non HMO)

## 2022-11-12 ENCOUNTER — Other Ambulatory Visit: Payer: Self-pay

## 2022-11-12 DIAGNOSIS — D508 Other iron deficiency anemias: Secondary | ICD-10-CM

## 2022-11-13 ENCOUNTER — Inpatient Hospital Stay: Payer: PRIVATE HEALTH INSURANCE | Attending: Nurse Practitioner

## 2022-11-13 DIAGNOSIS — D509 Iron deficiency anemia, unspecified: Secondary | ICD-10-CM | POA: Diagnosis not present

## 2022-11-13 DIAGNOSIS — D508 Other iron deficiency anemias: Secondary | ICD-10-CM

## 2022-11-13 DIAGNOSIS — Z79899 Other long term (current) drug therapy: Secondary | ICD-10-CM | POA: Insufficient documentation

## 2022-11-13 LAB — IRON AND IRON BINDING CAPACITY (CC-WL,HP ONLY)
Iron: 44 ug/dL (ref 28–170)
Saturation Ratios: 8 % — ABNORMAL LOW (ref 10.4–31.8)
TIBC: 554 ug/dL — ABNORMAL HIGH (ref 250–450)
UIBC: 510 ug/dL — ABNORMAL HIGH (ref 148–442)

## 2022-11-13 LAB — FERRITIN: Ferritin: 7 ng/mL — ABNORMAL LOW (ref 11–307)

## 2022-11-13 LAB — CBC WITH DIFFERENTIAL (CANCER CENTER ONLY)
Abs Immature Granulocytes: 0.02 10*3/uL (ref 0.00–0.07)
Basophils Absolute: 0 10*3/uL (ref 0.0–0.1)
Basophils Relative: 0 %
Eosinophils Absolute: 0 10*3/uL (ref 0.0–0.5)
Eosinophils Relative: 1 %
HCT: 41.5 % (ref 36.0–46.0)
Hemoglobin: 13.6 g/dL (ref 12.0–15.0)
Immature Granulocytes: 0 %
Lymphocytes Relative: 32 %
Lymphs Abs: 2.2 10*3/uL (ref 0.7–4.0)
MCH: 27.8 pg (ref 26.0–34.0)
MCHC: 32.8 g/dL (ref 30.0–36.0)
MCV: 84.7 fL (ref 80.0–100.0)
Monocytes Absolute: 0.4 10*3/uL (ref 0.1–1.0)
Monocytes Relative: 6 %
Neutro Abs: 4.3 10*3/uL (ref 1.7–7.7)
Neutrophils Relative %: 61 %
Platelet Count: 406 10*3/uL — ABNORMAL HIGH (ref 150–400)
RBC: 4.9 MIL/uL (ref 3.87–5.11)
RDW: 13.3 % (ref 11.5–15.5)
WBC Count: 7 10*3/uL (ref 4.0–10.5)
nRBC: 0 % (ref 0.0–0.2)

## 2022-11-15 LAB — CELIAC PANEL 10
Antigliadin Abs, IgA: 6 units (ref 0–19)
Endomysial Ab, IgA: NEGATIVE
Gliadin IgG: 3 units (ref 0–19)
IgA: 466 mg/dL — ABNORMAL HIGH (ref 87–352)
Tissue Transglut Ab: <2 U/mL (ref 0–5)
Tissue Transglutaminase Ab, IgA: <2 U/mL (ref 0–3)

## 2022-11-18 ENCOUNTER — Telehealth: Payer: Self-pay

## 2022-11-18 NOTE — Telephone Encounter (Signed)
Spoke with pt via telephone to inform pt that her iron was a little lower than it was 3 months ago but her anemia has resolved.  Informed pt that Dr. Mosetta Putt would like for the pt to restart her oral iron supplements.  Pt verbalized understanding.  Also, informed pt that her Celiac panel came back negative.  Informed pt that someone from our Scheduling Team will be contacting her to schedule a telephone visit with Dr. Mosetta Putt for next month.  Pt verbalized understanding and had no further questions or concerns at this time.

## 2022-12-18 ENCOUNTER — Inpatient Hospital Stay: Payer: PRIVATE HEALTH INSURANCE | Attending: Nurse Practitioner | Admitting: Nurse Practitioner

## 2022-12-18 ENCOUNTER — Encounter: Payer: Self-pay | Admitting: Nurse Practitioner

## 2022-12-18 DIAGNOSIS — D508 Other iron deficiency anemias: Secondary | ICD-10-CM | POA: Diagnosis not present

## 2022-12-18 NOTE — Progress Notes (Deleted)
[facility]   Telephone:(336) [phone] Fax:(336) [phone]   Clinic Follow up Note   Patient Care Team: Gretel Acre, MD as PCP - General (Family Medicine)  Date of Service:  12/18/2022  I connected with Natasha Carpenter on 12/18/2022 at 12:00 PM EDT by {Blank single:19197::"video enabled telemedicine visit","telephone visit"} and verified that I am speaking with the correct person using two identifiers.  I discussed the limitations, risks, security and privacy concerns of performing an evaluation and management service by telephone and the availability of in person appointments. I also discussed with the patient that there may be a patient responsible charge related to this service. The patient expressed understanding and agreed to proceed.   Other persons participating in the visit and their role in the encounter:  Sharlette Dense, CMA  Patient's location:  Home Provider's location:  Office  CHIEF COMPLAINT: f/u of Iron Deficiency Anemia  CURRENT THERAPY:  ***  ASSESSMENT & PLAN: *** Natasha Carpenter is a 42 y.o. female with   ***   PLAN    No problem-specific Assessment & Plan notes found for this encounter.    SUMMARY OF ONCOLOGIC HISTORY: Oncology History   No history exists.     INTERVAL HISTORY: *** Natasha Carpenter was contacted for a follow up of Iron Deficiency anemia. She was last seen by Santiago Glad NP on 04/03/2022.   All other systems were reviewed with the patient and are negative.  MEDICAL HISTORY:  Past Medical History:  Diagnosis Date   Status post vacuum-assisted vaginal delivery 12/04/2015    SURGICAL HISTORY: Past Surgical History:  Procedure Laterality Date   LAPAROSCOPY N/A 04/22/2013   Procedure: Diagnostic Operative Laparoscopy, Lysis of Adhesions, Drainage of Endometriomas;  Surgeon: Sharon Seller, DO;  Location: [facility];  Service: Gynecology;  Laterality: N/A;   WISDOM TOOTH EXTRACTION      I have reviewed the social history and family  history with the patient and they are unchanged from previous note.  ALLERGIES:  has No Known Allergies.  MEDICATIONS:  Current Outpatient Medications  Medication Sig Dispense Refill   ibuprofen (ADVIL,MOTRIN) 600 MG tablet Take 1 tablet (600 mg total) by mouth every 6 (six) hours as needed for moderate pain. 30 tablet 0   levothyroxine (SYNTHROID, LEVOTHROID) 50 MCG tablet Take 50 mcg by mouth daily.  5   Prenatal Vit-Fe Fumarate-FA (MULTIVITAMIN-PRENATAL) 27-0.8 MG TABS tablet Take 1 tablet by mouth daily at 12 noon.     No current facility-administered medications for this visit.    PHYSICAL EXAMINATION: ECOG PERFORMANCE STATUS: {CHL ONC ECOG PS:[id number]}  There were no vitals filed for this visit. Wt Readings from Last 3 Encounters:  04/03/22 113 lb 14.4 oz (51.7 kg)  12/04/15 141 lb (64 kg)  04/08/13 100 lb (45.4 kg)    *** No vitals taken today, Exam not performed today  LABORATORY DATA:  I have reviewed the data as listed    Latest Ref Rng & Units 11/13/2022    1:57 PM 08/07/2022   12:07 PM 04/03/2022   12:15 PM  CBC  WBC 4.0 - 10.5 K/uL 7.0  4.5  6.2   Hemoglobin 12.0 - 15.0 g/dL 30.8  65.7  84.6   Hematocrit 36.0 - 46.0 % 41.5  41.5  39.8   Platelets 150 - 400 K/uL 406  331  391          No data to display            RADIOGRAPHIC  STUDIES: I have personally reviewed the radiological images as listed and agreed with the findings in the report. No results found.    No orders of the defined types were placed in this encounter.  All questions were answered. The patient knows to call the clinic with any problems, questions or concerns. No barriers to learning was detected. The total time spent in the appointment was {CHL ONC TIME VISIT - [id number]}.     Verlee Rossetti, CMA 12/18/2022   I, Sharlette Dense, am acting as scribe for Malachy Mood, MD.   {Add scribe attestation statement}

## 2022-12-18 NOTE — Progress Notes (Addendum)
Patient Care Team: Natasha Acre, MD as PCP - General (Family Medicine)   I connected with [facility] on 12/18/22 at 12:00 PM EDT by telephone visit and verified that I am speaking with the correct person using two identifiers.   I discussed the limitations, risks, security and privacy concerns of performing an evaluation and management service by telemedicine and the availability of in-person appointments. I also discussed with the patient that there may be a patient responsible charge related to this service. The patient expressed understanding and agreed to proceed.   Other persons participating in the visit and their role in the encounter: none   Patient's location: Car  Provider's location: Air traffic controller Complaint: Follow up iron deficiency without anemia  CURRENT THERAPY: Oral iron po once daily  INTERVAL HISTORY Ms. Natasha Carpenter presents by phone for follow up as scheduled. Last seen by me 03/2022, took oral iron until 08/2022 when her iron deficiency resolved. She continues iron rich diet. She had routine labs 10/2022 which showed recurrent iron deficiency but without anemia. Celiac panel was negative. She resumed oral iron and is tolerating well. Still having menstrual periods monthly, lasting 3 days, "normal" flow not heavy. Denies other bleeding. She reportedly had negative stool test at PCP, no prior EGD/colonoscopy. She does not feel dizzy as long as she takes oral iron.   ROS  All other systems reviewed and negative  Past Medical History:  Diagnosis Date   Status post vacuum-assisted vaginal delivery 12/04/2015     Past Surgical History:  Procedure Laterality Date   LAPAROSCOPY N/A 04/22/2013   Procedure: Diagnostic Operative Laparoscopy, Lysis of Adhesions, Drainage of Endometriomas;  Surgeon: Sharon Seller, DO;  Location: [facility];  Service: Gynecology;  Laterality: N/A;   WISDOM TOOTH EXTRACTION       Outpatient Encounter Medications as of 12/18/2022  Medication Sig  Note   ibuprofen (ADVIL,MOTRIN) 600 MG tablet Take 1 tablet (600 mg total) by mouth every 6 (six) hours as needed for moderate pain.    levothyroxine (SYNTHROID, LEVOTHROID) 50 MCG tablet Take 50 mcg by mouth daily. 12/04/2015: Received from: External Pharmacy Received Sig: TAKE 1 TABLET (50 MCG TOTAL) BY MOUTH DAILY.   Prenatal Vit-Fe Fumarate-FA (MULTIVITAMIN-PRENATAL) 27-0.8 MG TABS tablet Take 1 tablet by mouth daily at 12 noon.    No facility-administered encounter medications on file as of 12/18/2022.     There were no vitals filed for this visit. There is no height or weight on file to calculate BMI.   PHYSICAL EXAM Patient appears well over the phone.  Voice is strong, speech is clear.  Mood/affect appear normal.  No cough or conversational dyspnea  CBC    Component Value Date/Time   WBC 7.0 11/13/2022 1357   WBC 12.2 (H) 12/05/2015 0539   RBC 4.90 11/13/2022 1357   HGB 13.6 11/13/2022 1357   HCT 41.5 11/13/2022 1357   PLT 406 (H) 11/13/2022 1357   MCV 84.7 11/13/2022 1357   MCH 27.8 11/13/2022 1357   MCHC 32.8 11/13/2022 1357   RDW 13.3 11/13/2022 1357   LYMPHSABS 2.2 11/13/2022 1357   MONOABS 0.4 11/13/2022 1357   EOSABS 0.0 11/13/2022 1357   BASOSABS 0.0 11/13/2022 1357     CMP  No results found for: "NA", "K", "CL", "CO2", "GLUCOSE", "BUN", "CREATININE", "CALCIUM", "PROT", "ALBUMIN", "AST", "ALT", "ALKPHOS", "BILITOT", "GFRNONAA", "GFRAA"   ASSESSMENT & PLAN:42 year old premenopausal female from [place]   Iron deficiency anemia and thrombocytosis -She had  a normal CBC in 2014 and became anemic 11/2015 with the delivery of her son, Hgb 8.9.  Subsequent labs are not available -Presented to PCP with symptomatic anemia, with fatigue and dizziness in 03/2022, labs showed Hgb 9.8, microcytosis MCV 66, thrombocytosis platelet 504, and iron panel consistent with IDA with ferritin 3, TIBC 568, serum iron 22, and 4% saturation -Further testing showed low-range normal B12 248,  normal folic acid and TSH.  No history of CKD or other chronic conditions.  The fact that she had a normal MCV in the past excludes thalassemia -she denies menorrhagia.  -Was eating less meat to control cholesterol; started iron rich diet 03/2022 -She reportedly had a negative stool test but has not had EGD/colonoscopy.   -She began oral iron 03/2022, by 08/2022 IDA resolved and she came off oral iron. By 10/2022 she had recurrent iron deficiency without anemia, but with mild thrombocytosis which is seen with iron deficiency  -Celiac panel is negative except elevated IgA, which is non specific and alone is nondiagnostic for celiac disease.  -She continues oral iron, tolerating well.  -I offered IV iron, she prefers to continue oral iron and iron rich diet -Will repeat lab in 05/2023, then phone f/up few days later. I recommend to repeat stool test at PCP in sept/oct to make sure we are not missing GI blood loss. She agrees -F/up few days after lab in November. She prefers to do lab here (rather than [facility]) and phone f/ups.    Fatigue and dizziness -Secondary to IDA -Resolved on oral iron   Hypothyroidism, endometriosis, and health maintenance -I reviewed age-appropriate cancer screenings and encouraged her to begin mammogram this year -Continue follow-up with PCP and OB/GYN   PLAN: -Recent labs reviewed -She declined IV Iron, continue oral iron and iron rich diet -Repeat stool test with PCP in sept/oct with next visit, to r/o GI blood loss -Lab in 05/2023, phone f/up few days later -CC note to new PCP, Moshe Cipro at [facility]  Orders Placed This Encounter  Procedures   Vitamin B12    Standing Status:   Standing    Number of Occurrences:   1    Standing Expiration Date:   12/18/2023     I discussed the assessment and treatment plan with the patient. The patient was provided an opportunity to ask questions and all were answered. The patient agreed with the plan and demonstrated an  understanding of the instructions.   The patient was advised to call back or seek an in-person evaluation if the symptoms worsen or if the condition fails to improve as anticipated.  No barriers to learning were detected. I spent 9 minutes counseling the patient non face to face. The total time spent in the appointment was 15 minutes and more than 50% was on counseling, review of test results, and coordination of care.   Santiago Glad, NP-C 12/18/2022

## 2023-01-04 ENCOUNTER — Ambulatory Visit
Admission: EM | Admit: 2023-01-04 | Discharge: 2023-01-04 | Disposition: A | Discharge status: Home / Self Care | Payer: PRIVATE HEALTH INSURANCE | Attending: Internal Medicine | Admitting: Internal Medicine

## 2023-01-04 DIAGNOSIS — H6993 Unspecified Eustachian tube disorder, bilateral: Secondary | ICD-10-CM

## 2023-01-04 DIAGNOSIS — J309 Allergic rhinitis, unspecified: Secondary | ICD-10-CM

## 2023-01-04 MED ORDER — FLUTICASONE PROPIONATE 50 MCG/ACT NA SUSP: 2.0000 | Freq: Every day | NASAL | 0 refills | Status: AC

## 2023-01-04 MED ORDER — LEVOCETIRIZINE DIHYDROCHLORIDE 5 MG PO TABS: 5.0000 mg | ORAL_TABLET | Freq: Every evening | ORAL | 0 refills | Status: AC

## 2023-01-04 MED ORDER — PSEUDOEPHEDRINE HCL 30 MG PO TABS: 30.0000 mg | ORAL_TABLET | Freq: Three times a day (TID) | ORAL | 0 refills | Status: AC | PRN

## 2023-01-04 NOTE — ED Triage Notes (Signed)
Pt c/o head congestion, runny nose x 3 months-states she has been seen at [facility] In clinic x 3-did complete abx x 3 rounds-NAD-steady gait

## 2023-01-04 NOTE — ED Provider Notes (Signed)
[facility]  Note:  This document was prepared using Conservation officer, historic buildings and may include unintentional dictation errors.  MRN: [id number] DOB: Oct 29, 1980  Subjective:   Natasha Carpenter is a 42 y.o. female presenting for 37-month history of persistent runny nose, sinus pressure and congestion, intermittent bilateral ear fullness worse to the right.  Has previously seen an ENT specialist and was advised to have procedural intervention for eustachian tube dysfunction.  However patient never underwent any kind of procedure for this and has not followed up.  More recently, has undergone a course of amoxicillin, doxycycline and prednisone. Last dosing was 4 months ago.  Patient does not have repetitive exposure to respiratory irritants as she works as an Airline pilot.  No cough, chest pain, shortness of breath or wheezing.  No smoking of any kind including cigarettes, cigars, vaping, marijuana use.    No current facility-administered medications for this encounter.  Current Outpatient Medications:    ibuprofen (ADVIL,MOTRIN) 600 MG tablet, Take 1 tablet (600 mg total) by mouth every 6 (six) hours as needed for moderate pain., Disp: 30 tablet, Rfl: 0   levothyroxine (SYNTHROID, LEVOTHROID) 50 MCG tablet, Take 50 mcg by mouth daily., Disp: , Rfl: 5   Prenatal Vit-Fe Fumarate-FA (MULTIVITAMIN-PRENATAL) 27-0.8 MG TABS tablet, Take 1 tablet by mouth daily at 12 noon., Disp: , Rfl:    No Known Allergies  Past Medical History:  Diagnosis Date   Status post vacuum-assisted vaginal delivery 12/04/2015     Past Surgical History:  Procedure Laterality Date   LAPAROSCOPY N/A 04/22/2013   Procedure: Diagnostic Operative Laparoscopy, Lysis of Adhesions, Drainage of Endometriomas;  Surgeon: Sharon Seller, DO;  Location: [facility];  Service: Gynecology;  Laterality: N/A;   WISDOM TOOTH EXTRACTION      Family History  Problem Relation Age of Onset   Cancer Neg Hx     Social  History   Tobacco Use   Smoking status: Never   Smokeless tobacco: Never  Vaping Use   Vaping Use: Never used  Substance Use Topics   Alcohol use: No   Drug use: No    ROS   Objective:   Vitals: BP 117/75 (BP Location: Right Arm)   Pulse 89   Temp 98.9 F (37.2 C) (Oral)   Resp 16   LMP 12/20/2022   SpO2 97%   Physical Exam Constitutional:      General: She is not in acute distress.    Appearance: Normal appearance. She is well-developed and normal weight. She is not ill-appearing, toxic-appearing or diaphoretic.  HENT:     Head: Normocephalic and atraumatic.     Right Ear: Ear canal and external ear normal. No drainage or tenderness. No middle ear effusion. There is no impacted cerumen. Tympanic membrane is not erythematous or bulging.     Left Ear: Ear canal and external ear normal. No drainage or tenderness.  No middle ear effusion. There is no impacted cerumen. Tympanic membrane is not erythematous or bulging.     Ears:     Comments: TMs opacified bilaterally.    Nose: Congestion present. No rhinorrhea.     Mouth/Throat:     Mouth: Mucous membranes are moist. No oral lesions.     Pharynx: No pharyngeal swelling, oropharyngeal exudate, posterior oropharyngeal erythema or uvula swelling.     Tonsils: No tonsillar exudate or tonsillar abscesses.  Eyes:     General: No scleral icterus.       Right eye:  No discharge.        Left eye: No discharge.     Extraocular Movements: Extraocular movements intact.     Right eye: Normal extraocular motion.     Left eye: Normal extraocular motion.     Conjunctiva/sclera: Conjunctivae normal.  Cardiovascular:     Rate and Rhythm: Normal rate.  Pulmonary:     Effort: Pulmonary effort is normal.  Musculoskeletal:     Cervical back: Normal range of motion and neck supple.  Lymphadenopathy:     Cervical: No cervical adenopathy.  Skin:    General: Skin is warm and dry.  Neurological:     General: No focal deficit present.      Mental Status: She is alert and oriented to person, place, and time.  Psychiatric:        Mood and Affect: Mood normal.        Behavior: Behavior normal.     Assessment and Plan :   PDMP not reviewed this encounter.  1. Allergic rhinitis, unspecified seasonality, unspecified trigger   2. Eustachian tube dysfunction, bilateral    Low suspicion for bacterial sinusitis, secondary bacterial middle ear infections.  Will defer further antibiotic use and steroid use.  Recommended Flonase, Xyzal to be used long-term.  Use pseudoephedrine as needed.  Follow-up with ENT.  Counseled patient on potential for adverse effects with medications prescribed/recommended today, ER and return-to-clinic precautions discussed, patient verbalized understanding.    Wallis Bamberg, [place] 01/05/23 [phone]

## 2023-02-10 ENCOUNTER — Inpatient Hospital Stay: Payer: PRIVATE HEALTH INSURANCE

## 2023-02-13 ENCOUNTER — Inpatient Hospital Stay: Payer: PRIVATE HEALTH INSURANCE | Admitting: Nurse Practitioner

## 2023-05-26 ENCOUNTER — Telehealth: Payer: Self-pay | Admitting: Nurse Practitioner

## 2023-05-27 ENCOUNTER — Inpatient Hospital Stay: Payer: PRIVATE HEALTH INSURANCE | Admitting: Nurse Practitioner

## 2024-04-05 ENCOUNTER — Ambulatory Visit (INDEPENDENT_AMBULATORY_CARE_PROVIDER_SITE_OTHER): Admitting: Otolaryngology

## 2024-04-05 ENCOUNTER — Encounter (INDEPENDENT_AMBULATORY_CARE_PROVIDER_SITE_OTHER): Payer: Self-pay | Admitting: Otolaryngology

## 2024-04-05 VITALS — BP 106/73 | HR 78 | Temp 98.2°F | Ht 59.0 in | Wt 95.0 lb

## 2024-04-05 DIAGNOSIS — R0982 Postnasal drip: Secondary | ICD-10-CM | POA: Diagnosis not present

## 2024-04-05 DIAGNOSIS — H9201 Otalgia, right ear: Secondary | ICD-10-CM | POA: Diagnosis not present

## 2024-04-05 DIAGNOSIS — R0981 Nasal congestion: Secondary | ICD-10-CM

## 2024-04-05 DIAGNOSIS — J310 Chronic rhinitis: Secondary | ICD-10-CM | POA: Diagnosis not present

## 2024-04-05 MED ORDER — IPRATROPIUM BROMIDE 0.06 % NA SOLN: 2.0000 | Freq: Two times a day (BID) | NASAL | 12 refills | Status: AC | PRN

## 2024-04-08 DIAGNOSIS — R0982 Postnasal drip: Secondary | ICD-10-CM | POA: Insufficient documentation

## 2024-04-08 DIAGNOSIS — H9201 Otalgia, right ear: Secondary | ICD-10-CM | POA: Insufficient documentation

## 2024-04-08 DIAGNOSIS — J310 Chronic rhinitis: Secondary | ICD-10-CM | POA: Insufficient documentation

## 2024-04-08 NOTE — Progress Notes (Signed)
 Patient ID: Natasha Carpenter, female   DOB: [birth year], 43 y.o.   MRN: [id number]  CC: Right ear pain, nasal congestion, postnasal drainage  HPI: The patient is a 43 year old female who presents today complaining of right ear pain and pressure for the past week.  The patient has a history of right TMJ disorder.  She uses a mouthguard at home.  In addition, she also complains of increasing nasal congestion over the past month.  She has noted frequent postnasal drainage.  She is currently using Flonase  nasal spray daily.  She denies any facial pain, fever, or visual change.  Exam: General: Communicates without difficulty, well nourished, no acute distress. Head: Normocephalic, no evidence injury, no tenderness, facial buttresses intact without stepoff. Face/sinus: No tenderness to palpation and percussion. Facial movement is normal and symmetric. Eyes: PERRL, EOMI. No scleral icterus, conjunctivae clear. Neuro: CN II exam reveals vision grossly intact.  No nystagmus at any point of gaze. Ears: Auricles well formed without lesions.  Ear canals are intact without mass or lesion.  No erythema or edema is appreciated.  The TMs are intact without fluid.  The right TMJ is tender to touch.  Nose: External evaluation reveals normal support and skin without lesions.  Dorsum is intact.  Anterior rhinoscopy reveals congested mucosa over anterior aspect of inferior turbinates and intact septum.  No purulence noted. Oral:  Oral cavity and oropharynx are intact, symmetric, without erythema or edema.  Mucosa is moist without lesions. Neck: Full range of motion without pain.  There is no significant lymphadenopathy.  No masses palpable.  Thyroid bed within normal limits to palpation.  Parotid glands and submandibular glands equal bilaterally without mass.  Trachea is midline. Neuro:  CN 2-12 grossly intact.   Assessment: 1.  Referred right otalgia, secondary to right TMJ disorder. 2.  Her ear canals, tympanic membranes, and  middle ear spaces are otherwise normal. 3.  Chronic rhinitis with nasal mucosal congestion and postnasal drainage.  Plan: 1.  The physical exam findings are reviewed with the patient. 2.  She is reassured that no acute ear infection is noted today. 3.  NSAIDs to treat the referred otalgia.  She should continue the use of her mouthguard. 4.  Atrovent nasal spray to treat the chronic postnasal drainage. 5.  The patient is encouraged to call for any questions or concerns.
